# Patient Record
Sex: Female | Born: 1990 | Race: Black or African American | Hispanic: No | Smoking: Never smoker
Health system: Southern US, Community
[De-identification: ages and names within clinical notes are randomized; demographics above are authoritative.]

## PROBLEM LIST (undated history)

## (undated) ENCOUNTER — Inpatient Hospital Stay (HOSPITAL_COMMUNITY): Payer: Self-pay

## (undated) DIAGNOSIS — B999 Unspecified infectious disease: Secondary | ICD-10-CM

## (undated) DIAGNOSIS — A749 Chlamydial infection, unspecified: Secondary | ICD-10-CM

## (undated) DIAGNOSIS — A599 Trichomoniasis, unspecified: Secondary | ICD-10-CM

## (undated) DIAGNOSIS — F419 Anxiety disorder, unspecified: Secondary | ICD-10-CM

## (undated) DIAGNOSIS — F329 Major depressive disorder, single episode, unspecified: Secondary | ICD-10-CM

## (undated) DIAGNOSIS — G932 Benign intracranial hypertension: Secondary | ICD-10-CM

## (undated) DIAGNOSIS — R51 Headache: Secondary | ICD-10-CM

## (undated) DIAGNOSIS — F32A Depression, unspecified: Secondary | ICD-10-CM

## (undated) HISTORY — PX: WISDOM TOOTH EXTRACTION: SHX21

## (undated) HISTORY — PX: LUMBAR PUNCTURE: SHX1985

## (undated) HISTORY — PX: NO PAST SURGERIES: SHX2092

---
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
## 2011-04-09 ENCOUNTER — Inpatient Hospital Stay (INDEPENDENT_AMBULATORY_CARE_PROVIDER_SITE_OTHER)
Admission: RE | Admit: 2011-04-09 | Discharge: 2011-04-09 | Disposition: A | Discharge status: Home / Self Care | Payer: Medicaid Other | Source: Ambulatory Visit | Attending: Family Medicine | Admitting: Family Medicine

## 2011-04-09 DIAGNOSIS — J069 Acute upper respiratory infection, unspecified: Secondary | ICD-10-CM

## 2011-04-09 DIAGNOSIS — K602 Anal fissure, unspecified: Secondary | ICD-10-CM

## 2011-04-27 ENCOUNTER — Inpatient Hospital Stay (INDEPENDENT_AMBULATORY_CARE_PROVIDER_SITE_OTHER)
Admission: RE | Admit: 2011-04-27 | Discharge: 2011-04-27 | Disposition: A | Discharge status: Home / Self Care | Payer: Medicaid Other | Source: Ambulatory Visit | Attending: Emergency Medicine | Admitting: Emergency Medicine

## 2011-04-27 DIAGNOSIS — Z3201 Encounter for pregnancy test, result positive: Secondary | ICD-10-CM

## 2011-04-27 LAB — POCT URINALYSIS DIP (DEVICE)
Ketones, ur: NEGATIVE mg/dL
Protein, ur: NEGATIVE mg/dL
Urobilinogen, UA: 0.2 mg/dL (ref 0.0–1.0)
pH: 7.5 (ref 5.0–8.0)

## 2011-04-27 LAB — POCT PREGNANCY, URINE: Preg Test, Ur: POSITIVE

## 2011-04-28 LAB — URINE CULTURE: Culture  Setup Time: 201209302132

## 2011-04-30 ENCOUNTER — Inpatient Hospital Stay (HOSPITAL_COMMUNITY): Payer: Medicaid Other

## 2011-04-30 ENCOUNTER — Encounter (HOSPITAL_COMMUNITY): Payer: Self-pay | Admitting: *Deleted

## 2011-04-30 ENCOUNTER — Inpatient Hospital Stay (HOSPITAL_COMMUNITY)
Admission: AD | Admit: 2011-04-30 | Discharge: 2011-04-30 | Disposition: A | Discharge status: Home / Self Care | Payer: Medicaid Other | Source: Ambulatory Visit | Attending: Obstetrics & Gynecology | Admitting: Obstetrics & Gynecology

## 2011-04-30 DIAGNOSIS — O209 Hemorrhage in early pregnancy, unspecified: Secondary | ICD-10-CM | POA: Insufficient documentation

## 2011-04-30 DIAGNOSIS — B379 Candidiasis, unspecified: Secondary | ICD-10-CM

## 2011-04-30 DIAGNOSIS — A499 Bacterial infection, unspecified: Secondary | ICD-10-CM

## 2011-04-30 DIAGNOSIS — B373 Candidiasis of vulva and vagina: Secondary | ICD-10-CM | POA: Insufficient documentation

## 2011-04-30 DIAGNOSIS — B9689 Other specified bacterial agents as the cause of diseases classified elsewhere: Secondary | ICD-10-CM | POA: Insufficient documentation

## 2011-04-30 DIAGNOSIS — O4690 Antepartum hemorrhage, unspecified, unspecified trimester: Secondary | ICD-10-CM

## 2011-04-30 DIAGNOSIS — O2390 Unspecified genitourinary tract infection in pregnancy, unspecified trimester: Secondary | ICD-10-CM | POA: Insufficient documentation

## 2011-04-30 DIAGNOSIS — N760 Acute vaginitis: Secondary | ICD-10-CM | POA: Insufficient documentation

## 2011-04-30 DIAGNOSIS — B3731 Acute candidiasis of vulva and vagina: Secondary | ICD-10-CM | POA: Insufficient documentation

## 2011-04-30 LAB — CBC
MCV: 83 fL (ref 78.0–100.0)
Platelets: 187 10*3/uL (ref 150–400)
RDW: 13 % (ref 11.5–15.5)
WBC: 5.3 10*3/uL (ref 4.0–10.5)

## 2011-04-30 LAB — WET PREP, GENITAL: Trich, Wet Prep: NONE SEEN

## 2011-04-30 LAB — DIFFERENTIAL
Basophils Absolute: 0 10*3/uL (ref 0.0–0.1)
Eosinophils Relative: 1 % (ref 0–5)
Lymphocytes Relative: 47 % — ABNORMAL HIGH (ref 12–46)
Neutro Abs: 2.4 10*3/uL (ref 1.7–7.7)

## 2011-04-30 LAB — URINALYSIS, ROUTINE W REFLEX MICROSCOPIC
Ketones, ur: NEGATIVE mg/dL
Nitrite: NEGATIVE
Protein, ur: NEGATIVE mg/dL
pH: 7 (ref 5.0–8.0)

## 2011-04-30 LAB — POCT PREGNANCY, URINE: Preg Test, Ur: POSITIVE

## 2011-04-30 LAB — URINE MICROSCOPIC-ADD ON

## 2011-04-30 LAB — HCG, QUANTITATIVE, PREGNANCY: hCG, Beta Chain, Quant, S: 306 m[IU]/mL — ABNORMAL HIGH (ref <5)

## 2011-04-30 MED ORDER — METRONIDAZOLE 500 MG PO TABS: 500.0000 mg | ORAL_TABLET | Freq: Two times a day (BID) | ORAL | Status: AC

## 2011-04-30 MED ORDER — FLUCONAZOLE 150 MG PO TABS
150.0000 mg | ORAL_TABLET | Freq: Once | ORAL | Status: AC
  Administered 2011-04-30: 150 mg via ORAL
  Filled 2011-04-30: qty 1

## 2011-04-30 MED ORDER — PROMETHAZINE HCL 25 MG PO TABS: 25.0000 mg | ORAL_TABLET | Freq: Four times a day (QID) | ORAL | Status: AC | PRN

## 2011-04-30 NOTE — ED Provider Notes (Signed)
History     CSN: [id number] Arrival date & time: 04/30/2011 12:32 PM  Chief Complaint  Patient presents with  . Vaginal Bleeding    HPI Ariana Bentley is a.age female @ 4.[redacted] weeks gestation who presents to [facility]U for vaginal bleeding in early pregnancy with lower abdominal cramping. LMP 03/30/11 and lasted 3 days. Hx of chlamydia this year and treated along with partner. Last pap smear at [facility] this year and was normal.   Past Medical History  Diagnosis Date  . Asthma   . Pseudo-Turner syndrome     Past Surgical History  Procedure Date  . Lumbar puncture     No family history on file.  History  Substance Use Topics  . Smoking status: Current Some Day Smoker  . Smokeless tobacco: Not on file  . Alcohol Use: No    OB History    Grav Para Term Preterm Abortions TAB SAB Ect Mult Living   1               Review of Systems  Constitutional: Negative for fever, chills, diaphoresis and fatigue.  HENT: Negative for ear pain, congestion, sore throat, facial swelling, neck pain, neck stiffness, dental problem and sinus pressure.   Eyes: Negative for photophobia, pain and discharge.  Respiratory: Negative for cough, chest tightness and wheezing.   Cardiovascular: Negative.   Gastrointestinal: Positive for abdominal pain and constipation. Negative for nausea, vomiting, diarrhea and abdominal distention.  Genitourinary: Negative for dysuria, frequency, flank pain and difficulty urinating.  Musculoskeletal: Positive for back pain. Negative for myalgias and gait problem.  Skin: Negative for color change.       eczema  Neurological: Positive for headaches. Negative for dizziness, speech difficulty, weakness, light-headedness and numbness.  Psychiatric/Behavioral: Negative for confusion and agitation. The patient is not nervous/anxious.     Allergies  Doxycycline  Home Medications  No current outpatient prescriptions on file.  BP 114/65  Pulse 68  Temp(Src) 99.3 F (37.4 C)  (Oral)  Resp 18  Ht 5\' 6"  (1.676 m)  Wt 221 lb 2 oz (100.302 kg)  BMI 35.69 kg/m2  LMP 03/30/2011  Physical Exam  Nursing note and vitals reviewed. Constitutional: She is oriented to person, place, and time. She appears well-developed and well-nourished.  HENT:  Head: Normocephalic.  Eyes: EOM are normal.  Neck: Neck supple.  Pulmonary/Chest: Effort normal.  Abdominal: Soft. There is no tenderness.       obese  Genitourinary:       External genitalia pink, moist, without lesions. Frothy discharge, malodorous. Cervix closed, long. Minimal CMT, no adnexal tenderness. No palpable enlargement of the uterus; however, exam limited due to patient habitus.  Musculoskeletal: Normal range of motion.  Neurological: She is alert and oriented to person, place, and time. No cranial nerve deficit.  Skin: Skin is warm and dry.    ED Course  Procedures   Results for orders placed during the hospital encounter of 04/30/11 (from the past 24 hour(s))  URINALYSIS, ROUTINE W REFLEX MICROSCOPIC     Status: Abnormal   Collection Time   04/30/11  1:05 PM      Component Value Range   Color, Urine YELLOW  YELLOW    Appearance CLEAR  CLEAR    Specific Gravity, Urine 1.015  1.005 - 1.030    pH 7.0  5.0 - 8.0    Glucose, UA NEGATIVE  NEGATIVE (mg/dL)   Hgb urine dipstick NEGATIVE  NEGATIVE    Bilirubin Urine NEGATIVE  NEGATIVE    Ketones, ur NEGATIVE  NEGATIVE (mg/dL)   Protein, ur NEGATIVE  NEGATIVE (mg/dL)   Urobilinogen, UA 0.2  0.0 - 1.0 (mg/dL)   Nitrite NEGATIVE  NEGATIVE    Leukocytes, UA SMALL (*) NEGATIVE   URINE MICROSCOPIC-ADD ON     Status: Abnormal   Collection Time   04/30/11  1:05 PM      Component Value Range   Squamous Epithelial / LPF MANY (*) RARE    WBC, UA 0-2  <3 (WBC/hpf)   Bacteria, UA FEW (*) RARE   POCT PREGNANCY, URINE     Status: Normal   Collection Time   04/30/11  1:28 PM      Component Value Range   Preg Test, Ur POSITIVE    CBC     Status: Normal   Collection  Time   04/30/11  1:49 PM      Component Value Range   WBC 5.3  4.0 - 10.5 (K/uL)   RBC 4.99  3.87 - 5.11 (MIL/uL)   Hemoglobin 14.6  12.0 - 15.0 (g/dL)   HCT 16.1  09.6 - 04.5 (%)   MCV 83.0  78.0 - 100.0 (fL)   MCH 29.3  26.0 - 34.0 (pg)   MCHC 35.3  30.0 - 36.0 (g/dL)   RDW 40.9  81.1 - 91.4 (%)   Platelets 187  150 - 400 (K/uL)  DIFFERENTIAL     Status: Abnormal   Collection Time   04/30/11  1:49 PM      Component Value Range   Neutrophils Relative 45  43 - 77 (%)   Neutro Abs 2.4  1.7 - 7.7 (K/uL)   Lymphocytes Relative 47 (*) 12 - 46 (%)   Lymphs Abs 2.5  0.7 - 4.0 (K/uL)   Monocytes Relative 6  3 - 12 (%)   Monocytes Absolute 0.3  0.1 - 1.0 (K/uL)   Eosinophils Relative 1  0 - 5 (%)   Eosinophils Absolute 0.1  0.0 - 0.7 (K/uL)   Basophils Relative 0  0 - 1 (%)   Basophils Absolute 0.0  0.0 - 0.1 (K/uL)  HCG, QUANTITATIVE, PREGNANCY     Status: Abnormal   Collection Time   04/30/11  1:49 PM      Component Value Range   hCG, Beta Chain, Quant, S 306 (*) <5 (mIU/mL)  ABO/RH     Status: Normal   Collection Time   04/30/11  1:49 PM      Component Value Range   ABO/RH(D) O POS    WET PREP, GENITAL     Status: Abnormal   Collection Time   04/30/11  3:05 PM      Component Value Range   Yeast, Wet Prep FEW (*) NONE SEEN    Trich, Wet Prep NONE SEEN  NONE SEEN    Clue Cells, Wet Prep FEW (*) NONE SEEN    WBC, Wet Prep HPF POC TOO NUMEROUS TO COUNT (*) NONE SEEN    MDM  Ultrasound today shows no IUP or adnexal mass.   Assessment: Bleeding in early pregnancy   Monilia vaginosis   Bacterial vaginosis  Plan:  Diflucan 150 mg. Po now   Flagyl 500 mg. Po bid x 7 days   Phenergan 25 mg. Po q 6 hours prn nausea   Return in 2 days to repeat the Bhcg   Ectopic precautions.          [place], NP 04/30/11 1733  Tanner Vigna  Damian Leavell, NP 04/30/11 1737

## 2011-04-30 NOTE — Progress Notes (Signed)
Pt states, " [facility] and had orange bleeding when I wiped, but I'm not hurting."

## 2011-05-14 LAB — OB RESULTS CONSOLE ANTIBODY SCREEN: Antibody Screen: NEGATIVE

## 2011-05-14 LAB — OB RESULTS CONSOLE HEPATITIS B SURFACE ANTIGEN: Hepatitis B Surface Ag: NEGATIVE

## 2011-05-14 LAB — OB RESULTS CONSOLE RPR: RPR: NONREACTIVE

## 2011-05-16 ENCOUNTER — Inpatient Hospital Stay (HOSPITAL_COMMUNITY)
Admission: AD | Admit: 2011-05-16 | Discharge: 2011-05-16 | Disposition: A | Discharge status: Home / Self Care | Payer: Medicaid Other | Source: Ambulatory Visit | Attending: Obstetrics & Gynecology | Admitting: Obstetrics & Gynecology

## 2011-05-16 ENCOUNTER — Inpatient Hospital Stay (HOSPITAL_COMMUNITY): Payer: Medicaid Other

## 2011-05-16 ENCOUNTER — Encounter (HOSPITAL_COMMUNITY): Payer: Self-pay | Admitting: *Deleted

## 2011-05-16 DIAGNOSIS — Z3490 Encounter for supervision of normal pregnancy, unspecified, unspecified trimester: Secondary | ICD-10-CM

## 2011-05-16 DIAGNOSIS — Z1389 Encounter for screening for other disorder: Secondary | ICD-10-CM

## 2011-05-16 DIAGNOSIS — O99891 Other specified diseases and conditions complicating pregnancy: Secondary | ICD-10-CM | POA: Insufficient documentation

## 2011-05-16 HISTORY — DX: Chlamydial infection, unspecified: A74.9

## 2011-05-16 HISTORY — DX: Trichomoniasis, unspecified: A59.9

## 2011-05-16 LAB — CBC
Hemoglobin: 14.7 g/dL (ref 12.0–15.0)
MCH: 29.6 pg (ref 26.0–34.0)
MCHC: 35.9 g/dL (ref 30.0–36.0)
MCV: 82.3 fL (ref 78.0–100.0)

## 2011-05-16 LAB — HCG, QUANTITATIVE, PREGNANCY: hCG, Beta Chain, Quant, S: 47502 m[IU]/mL — ABNORMAL HIGH (ref <5)

## 2011-05-16 NOTE — ED Provider Notes (Signed)
History     Chief Complaint  Patient presents with  . Possible Pregnancy   HPI Care assumed from [facility] NP.   Awaited [place] results. Quant HCG pending   Past Medical History  Diagnosis Date  . Asthma   . Pseudo-Turner syndrome   . Trichomonas   . Chlamydia     Past Surgical History  Procedure Date  . Lumbar puncture     History reviewed. No pertinent family history.  History  Substance Use Topics  . Smoking status: Current Some Day Smoker  . Smokeless tobacco: Not on file  . Alcohol Use: No    Allergies:  Allergies  Allergen Reactions  . Shellfish Allergy Anaphylaxis  . Doxycycline Other (See Comments)    Causes swelling behind the eyes.    Prescriptions prior to admission  Medication Sig Dispense Refill  . polyethylene glycol (MIRALAX / GLYCOLAX) packet Take 17 g by mouth daily as needed. bm       . prenatal vitamin w/FE, FA (PRENATAL 1 + 1) 27-1 MG TABS Take 1 tablet by mouth daily.        Tery Sanfilippo Sodium (EASY-LAX PO) Take 1 tablet by mouth daily as needed. For constipation.       . lidocaine (XYLOCAINE) 5 % ointment Apply 1 application topically as needed. For pain and itching.        ROS See previous provider notes.  Physical Exam   Blood pressure 135/63, pulse 94, temperature 99.2 F (37.3 C), temperature source Oral, resp. rate 20, height 5\' 7"  (1.702 m), weight 224 lb (101.606 kg), last menstrual period 03/30/2011, SpO2 99.00%.  Physical Exam See previous provider notes. [place]:  SIUP at 6.5 weeks FHR 128 + yolk sac Left CLC Otherwise normal.  MAU Course  Procedures  Assessment and Plan  A:  IUP at 6.5 weeks FHR 128  P:  Has appt with Dr Gaynell Face tomorrow.  Will followup with him.   [facility] 05/16/2011, 3:39 PM

## 2011-05-16 NOTE — Progress Notes (Signed)
Pt states she was straining to have BM this am and noticed some blood when she wiped. Pt does not know if it is coming from her vagina or rectum.

## 2011-06-29 ENCOUNTER — Emergency Department (HOSPITAL_COMMUNITY)
Admission: EM | Admit: 2011-06-29 | Discharge: 2011-06-30 | Disposition: A | Discharge status: Home / Self Care | Payer: Medicaid Other | Attending: Emergency Medicine | Admitting: Emergency Medicine

## 2011-06-29 ENCOUNTER — Encounter (HOSPITAL_COMMUNITY): Payer: Self-pay | Admitting: Emergency Medicine

## 2011-06-29 DIAGNOSIS — O2390 Unspecified genitourinary tract infection in pregnancy, unspecified trimester: Secondary | ICD-10-CM | POA: Insufficient documentation

## 2011-06-29 DIAGNOSIS — N390 Urinary tract infection, site not specified: Secondary | ICD-10-CM | POA: Insufficient documentation

## 2011-06-29 DIAGNOSIS — R51 Headache: Secondary | ICD-10-CM | POA: Insufficient documentation

## 2011-06-29 DIAGNOSIS — J45909 Unspecified asthma, uncomplicated: Secondary | ICD-10-CM | POA: Insufficient documentation

## 2011-06-29 DIAGNOSIS — E860 Dehydration: Secondary | ICD-10-CM | POA: Insufficient documentation

## 2011-06-29 DIAGNOSIS — O26899 Other specified pregnancy related conditions, unspecified trimester: Secondary | ICD-10-CM

## 2011-06-29 DIAGNOSIS — O99891 Other specified diseases and conditions complicating pregnancy: Secondary | ICD-10-CM | POA: Insufficient documentation

## 2011-06-29 DIAGNOSIS — O2340 Unspecified infection of urinary tract in pregnancy, unspecified trimester: Secondary | ICD-10-CM

## 2011-06-29 LAB — URINALYSIS, ROUTINE W REFLEX MICROSCOPIC
Bilirubin Urine: NEGATIVE
Glucose, UA: NEGATIVE mg/dL
Hgb urine dipstick: NEGATIVE
Ketones, ur: >80 mg/dL — AB
pH: 6 (ref 5.0–8.0)

## 2011-06-29 LAB — URINE MICROSCOPIC-ADD ON

## 2011-06-29 LAB — POCT I-STAT, CHEM 8
HCT: 42 % (ref 36.0–46.0)
Hemoglobin: 14.3 g/dL (ref 12.0–15.0)
Potassium: 3.7 mEq/L (ref 3.5–5.1)
Sodium: 136 mEq/L (ref 135–145)
TCO2: 19 mmol/L (ref 0–100)

## 2011-06-29 MED ORDER — BUTALBITAL-APAP-CAFFEINE 50-325-40 MG PO TABS: 1.0000 | ORAL_TABLET | Freq: Four times a day (QID) | ORAL | Status: DC | PRN

## 2011-06-29 MED ORDER — NITROFURANTOIN MONOHYD MACRO 100 MG PO CAPS
100.0000 mg | ORAL_CAPSULE | ORAL | Status: AC
  Administered 2011-06-30: 100 mg via ORAL
  Filled 2011-06-29: qty 1

## 2011-06-29 MED ORDER — ONDANSETRON HCL 4 MG PO TABS: 4.0000 mg | ORAL_TABLET | Freq: Four times a day (QID) | ORAL | Status: AC

## 2011-06-29 MED ORDER — NITROFURANTOIN MONOHYD MACRO 100 MG PO CAPS: 100.0000 mg | ORAL_CAPSULE | Freq: Two times a day (BID) | ORAL | Status: AC

## 2011-06-29 MED ORDER — ONDANSETRON 4 MG PO TBDP
4.0000 mg | ORAL_TABLET | Freq: Once | ORAL | Status: AC
  Administered 2011-06-29: 4 mg via ORAL
  Filled 2011-06-29: qty 1

## 2011-06-29 MED ORDER — ACETAMINOPHEN 325 MG PO TABS
650.0000 mg | ORAL_TABLET | Freq: Once | ORAL | Status: AC
  Administered 2011-06-29: 650 mg via ORAL
  Filled 2011-06-29: qty 1

## 2011-06-29 NOTE — ED Notes (Signed)
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

## 2011-06-29 NOTE — ED Notes (Signed)
C/o pain to R side of head since 9am.

## 2011-06-29 NOTE — ED Notes (Signed)
Barcode for meds would not allow to scan down hallway away from computer, had to override in computer,

## 2011-06-29 NOTE — ED Notes (Signed)
Pt is [redacted] weeks pregnant.  Reports prenatal care.

## 2011-06-29 NOTE — ED Provider Notes (Cosign Needed)
History     CSN: [id number] Arrival date & time: 06/29/2011  8:10 PM   First MD Initiated Contact with Patient 06/29/11 2126      Chief Complaint  Patient presents with  . Headache   patient is a 20 year old female with a history of asthma,  Pseudotumor,  trichomonas, chlamydia, and states she is currently 13 weeks, pregnant. She's been having a right-sided headache since this morning, which began with a gradual onset. She has minimal nausea. The pain is described as sharp and throbbing. She did take Tylenol with some relief. She's had no vomiting, no visual or speech changes, no neck pain, no numbness, weakness or tingling. Denies any fever or any recent injuries.  (Consider location/radiation/quality/duration/timing/severity/associated sxs/prior treatment) HPI  Past Medical History  Diagnosis Date  . Asthma   . Pseudo-Turner syndrome   . Trichomonas   . Chlamydia     Past Surgical History  Procedure Date  . Lumbar puncture     No family history on file.  History  Substance Use Topics  . Smoking status: Current Some Day Smoker  . Smokeless tobacco: Not on file  . Alcohol Use: No    OB History    Grav Para Term Preterm Abortions TAB SAB Ect Mult Living   1               Review of Systems  All other systems reviewed and are negative.    Allergies  Shellfish allergy and Doxycycline  Home Medications   Current Outpatient Rx  Name Route Sig Dispense Refill  . POLYETHYLENE GLYCOL 3350 PO PACK Oral Take 17 g by mouth daily as needed. bm     . PRENATAL PLUS 27-1 MG PO TABS Oral Take 1 tablet by mouth daily.        BP 117/72  Pulse 94  Temp(Src) 98.3 F (36.8 C) (Oral)  Resp 18  SpO2 98%  LMP 03/30/2011  Physical Exam  Constitutional: She is oriented to person, place, and time. She appears well-developed and well-nourished.       Well-nourished well-developed, calm, comfortable, cooperative, no acute distress  HENT:  Head: Normocephalic and atraumatic.    Eyes: Conjunctivae and EOM are normal. Pupils are equal, round, and reactive to light.  Neck: Neck supple. No JVD present. No thyromegaly present.  Cardiovascular: Normal rate and regular rhythm.  Exam reveals no gallop and no friction rub.   No murmur heard. Pulmonary/Chest: Breath sounds normal. She has no wheezes. She has no rales. She exhibits no tenderness.  Abdominal: Soft. Bowel sounds are normal. She exhibits no distension. There is no tenderness. There is no rebound and no guarding.  Musculoskeletal: Normal range of motion.  Lymphadenopathy:    She has no cervical adenopathy.  Neurological: She is alert and oriented to person, place, and time. She has normal reflexes. No cranial nerve deficit. Coordination normal.       Neurologic exam shows no focal motor deficits. Patient is awake, alert, oriented. Reflexes are equal and symmetric.  Skin: Skin is warm and dry. No rash noted.  Psychiatric: She has a normal mood and affect.    ED Course  Procedures (including critical care time)  Labs Reviewed - No data to display No results found.   No diagnosis found.    MDM  Pt is seen and examined;  Initial history and physical completed.  Will follow.     11:07 PM Results for orders placed during the hospital encounter of 06/29/11  URINALYSIS, ROUTINE W REFLEX MICROSCOPIC      Component Value Range   Color, Urine YELLOW  YELLOW    APPearance CLOUDY (*) CLEAR    Specific Gravity, Urine 1.018  1.005 - 1.030    pH 6.0  5.0 - 8.0    Glucose, UA NEGATIVE  NEGATIVE (mg/dL)   Hgb urine dipstick NEGATIVE  NEGATIVE    Bilirubin Urine NEGATIVE  NEGATIVE    Ketones, ur >80 (*) NEGATIVE (mg/dL)   Protein, ur NEGATIVE  NEGATIVE (mg/dL)   Urobilinogen, UA 1.0  0.0 - 1.0 (mg/dL)   Nitrite NEGATIVE  NEGATIVE    Leukocytes, UA LARGE (*) NEGATIVE   POCT PREGNANCY, URINE      Component Value Range   Preg Test, Ur POSITIVE    URINE MICROSCOPIC-ADD ON      Component Value Range    Squamous Epithelial / LPF RARE  RARE    WBC, UA 7-10  <3 (WBC/hpf)   RBC / HPF 0-2  <3 (RBC/hpf)   Bacteria, UA FEW (*) RARE   POCT I-STAT, CHEM 8      Component Value Range   Sodium 136  135 - 145 (mEq/L)   Potassium 3.7  3.5 - 5.1 (mEq/L)   Chloride 105  96 - 112 (mEq/L)   BUN 7  6 - 23 (mg/dL)   Creatinine, Ser 4.09  0.50 - 1.10 (mg/dL)   Glucose, Bld 82  70 - 99 (mg/dL)   Calcium, Ion 8.11  9.14 - 1.32 (mmol/L)   TCO2 19  0 - 100 (mmol/L)   Hemoglobin 14.3  12.0 - 15.0 (g/dL)   HCT 78.2  95.6 - 21.3 (%)   No results found.  Urine analysis has been reviewed, showing positive elements for UTI. Electrolyte panel is normal, negative for protein in the urine. Will discuss this with her primary OB/GYN doctor for further recommendations.    11:24 PM  CT scan reviewed, will discuss with patient's primary obstetrician. Otherwise, remains very stable.        Johnsie Moscoso A. Patrica Duel, MD 06/29/11 2324  11:31 PM Findings discussed with OB/GYN. They are recommending conservative outpatient therapy, including Zofran, Fioricet, and IV fluid hydration. Also, agreeing with antibiotics for UTI. Patient is consuming Sprite without any difficulty and requesting food, which she is given. She is told to followup with her primary OB doctor, Dr. Gaynell Face, within 1-2 days. Return to the ER for any concerns or changing symptoms     Shrihaan Porzio A. Patrica Duel, MD 06/29/11 [phone]

## 2011-07-29 NOTE — L&D Delivery Note (Signed)
Delivery Note At 10:04 AM a viable female was delivered via  (Presentation: direct OA).     Placenta status: delivered intact.  Cord:  Nuchal cord x 1.  Clamped/cut prior to delivery of the anterior shoulder.  3 vessels.  Anesthesia: Epidural  Episiotomy: none Lacerations: sulcal Suture Repair: 3.0 vicryl rapide Est. Blood Loss (mL): 200 ml  Mom to postpartum.  Baby to nursery-stable.  JACKSON[place]MOORE,Chief Walkup A 12/20/2011, 10:23 AM

## 2011-09-07 ENCOUNTER — Encounter (HOSPITAL_COMMUNITY): Payer: Self-pay | Admitting: *Deleted

## 2011-09-07 ENCOUNTER — Inpatient Hospital Stay (HOSPITAL_COMMUNITY)
Admission: AD | Admit: 2011-09-07 | Discharge: 2011-09-07 | Disposition: A | Discharge status: Home / Self Care | Payer: Medicaid Other | Source: Ambulatory Visit | Attending: Obstetrics | Admitting: Obstetrics

## 2011-09-07 DIAGNOSIS — J40 Bronchitis, not specified as acute or chronic: Secondary | ICD-10-CM

## 2011-09-07 DIAGNOSIS — G932 Benign intracranial hypertension: Secondary | ICD-10-CM | POA: Insufficient documentation

## 2011-09-07 DIAGNOSIS — M545 Low back pain, unspecified: Secondary | ICD-10-CM | POA: Insufficient documentation

## 2011-09-07 DIAGNOSIS — B3731 Acute candidiasis of vulva and vagina: Secondary | ICD-10-CM | POA: Insufficient documentation

## 2011-09-07 DIAGNOSIS — O99891 Other specified diseases and conditions complicating pregnancy: Secondary | ICD-10-CM | POA: Insufficient documentation

## 2011-09-07 DIAGNOSIS — J069 Acute upper respiratory infection, unspecified: Secondary | ICD-10-CM | POA: Insufficient documentation

## 2011-09-07 DIAGNOSIS — M5430 Sciatica, unspecified side: Secondary | ICD-10-CM

## 2011-09-07 DIAGNOSIS — B373 Candidiasis of vulva and vagina: Secondary | ICD-10-CM

## 2011-09-07 DIAGNOSIS — O98819 Other maternal infectious and parasitic diseases complicating pregnancy, unspecified trimester: Secondary | ICD-10-CM | POA: Insufficient documentation

## 2011-09-07 HISTORY — DX: Benign intracranial hypertension: G93.2

## 2011-09-07 LAB — DIFFERENTIAL
Eosinophils Absolute: 0.1 10*3/uL (ref 0.0–0.7)
Eosinophils Relative: 1 % (ref 0–5)
Lymphs Abs: 2.3 10*3/uL (ref 0.7–4.0)
Monocytes Absolute: 0.5 10*3/uL (ref 0.1–1.0)

## 2011-09-07 LAB — COMPREHENSIVE METABOLIC PANEL
ALT: 14 U/L (ref 0–35)
BUN: 6 mg/dL (ref 6–23)
Calcium: 8.6 mg/dL (ref 8.4–10.5)
Creatinine, Ser: 0.55 mg/dL (ref 0.50–1.10)
GFR calc Af Amer: >90 mL/min (ref >90)
Glucose, Bld: 94 mg/dL (ref 70–99)
Sodium: 135 mEq/L (ref 135–145)
Total Protein: 6.2 g/dL (ref 6.0–8.3)

## 2011-09-07 LAB — URINALYSIS, ROUTINE W REFLEX MICROSCOPIC
Glucose, UA: NEGATIVE mg/dL
Leukocytes, UA: NEGATIVE
Protein, ur: NEGATIVE mg/dL
pH: 6 (ref 5.0–8.0)

## 2011-09-07 LAB — CBC
MCH: 29.1 pg (ref 26.0–34.0)
MCV: 85.2 fL (ref 78.0–100.0)
Platelets: 167 10*3/uL (ref 150–400)
RBC: 4.12 MIL/uL (ref 3.87–5.11)

## 2011-09-07 LAB — WET PREP, GENITAL: Trich, Wet Prep: NONE SEEN

## 2011-09-07 MED ORDER — AZITHROMYCIN 250 MG PO TABS: ORAL_TABLET | ORAL | Status: AC

## 2011-09-07 MED ORDER — FLUCONAZOLE 150 MG PO TABS
150.0000 mg | ORAL_TABLET | Freq: Once | ORAL | Status: AC
  Administered 2011-09-07: 150 mg via ORAL
  Filled 2011-09-07: qty 1

## 2011-09-07 NOTE — Progress Notes (Signed)
Pt presents with R sided lower back pain which is worse when bending over. Pt also has has a cough for 3 days and vomiting 2 times today. Pt states that she has some vaginal itching and white discharge. Pt states that she has not "eaten all day". " I tried to have cereal and thew it up".

## 2011-09-07 NOTE — ED Provider Notes (Signed)
History     CSN: [id number]  Arrival date & time 09/07/11  1437   None     Chief Complaint  Patient presents with  . Nasal Congestion  . Back Pain  . Vaginal Itching   HPI Ariana Bentley is a 21 y.o. female @ [redacted]w[redacted]d gestation who presents to [facility] for back pain, vaginal itching and discharge and URI symptoms. Symptoms started 3 days ago. Coughing up yellow mucous. Coughed till vomited one time today. Vaginal discharge that is white. 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 Current sex partner x 5 months. Hx of Chlamydia.   Past Medical History  Diagnosis Date  . Asthma   . Trichomonas   . Chlamydia   . Pseudotumor cerebri     Past Surgical History  Procedure Date  . Lumbar puncture     Family History  Problem Relation Age of Onset  . Anesthesia problems Neg Hx     History  Substance Use Topics  . Smoking status: Current Some Day Smoker  . Smokeless tobacco: Not on file  . Alcohol Use: No    OB History    Grav Para Term Preterm Abortions TAB SAB Ect Mult Living   1               Review of Systems  Constitutional: Negative for fever, chills, diaphoresis and fatigue.  HENT: Positive for congestion and sore throat. Negative for ear pain, facial swelling, neck pain, neck stiffness, dental problem and sinus pressure.   Eyes: Negative for photophobia, pain and discharge.  Respiratory: Negative for cough, chest tightness and wheezing.   Cardiovascular: Negative.   Gastrointestinal: Negative for nausea, vomiting, abdominal pain, diarrhea, constipation and abdominal distention.  Genitourinary: Positive for flank pain and vaginal discharge. Negative for dysuria, frequency, vaginal bleeding, difficulty urinating and pelvic pain. Vaginal pain: vaginal itching.  Musculoskeletal: Positive for back pain. Negative for myalgias and gait problem.  Skin: Positive for rash (eczema). Negative for color change.  Neurological: Positive for light-headedness and headaches. Negative for  dizziness, speech difficulty, weakness and numbness.  Psychiatric/Behavioral: Negative for confusion and agitation. The patient is not nervous/anxious.     Allergies  Shellfish allergy and Doxycycline  Home Medications  No current outpatient prescriptions on file.  Temp(Src) 97.7 F (36.5 C) (Oral)  Resp 18  Ht 5\' 7"  (1.702 m)  Wt 232 lb 9.6 oz (105.507 kg)  BMI 36.43 kg/m2  LMP 03/30/2011  Physical Exam  Nursing note and vitals reviewed. Constitutional: She is oriented to person, place, and time. She appears well-developed and well-nourished.  HENT:  Head: Normocephalic.  Eyes: EOM are normal.  Neck: Neck supple.  Cardiovascular: Normal rate and regular rhythm.   Pulmonary/Chest: Effort normal. No respiratory distress. She has no wheezes.       Occasional ronchi  Abdominal: Soft. There is no tenderness.  Genitourinary:       External genitalia without lesions. White discharge vaginal vault. Cervix closed, thick, high. Uterus consistent with dates.   Musculoskeletal: Normal range of motion.       Pain in buttocks with palpation in area of sciatic nerve.   Neurological: She is alert and oriented to person, place, and time. No cranial nerve deficit.  Skin: Skin is warm and dry.  Psychiatric: She has a normal mood and affect. Her behavior is normal. Judgment and thought content normal.   Results for orders placed during the hospital encounter of 09/07/11 (from the past 24 hour(s))  URINALYSIS, ROUTINE  W REFLEX MICROSCOPIC     Status: Abnormal   Collection Time   09/07/11  2:51 PM      Component Value Range   Color, Urine YELLOW  YELLOW    APPearance HAZY (*) CLEAR    Specific Gravity, Urine 1.025  1.005 - 1.030    pH 6.0  5.0 - 8.0    Glucose, UA NEGATIVE  NEGATIVE (mg/dL)   Hgb urine dipstick NEGATIVE  NEGATIVE    Bilirubin Urine NEGATIVE  NEGATIVE    Ketones, ur NEGATIVE  NEGATIVE (mg/dL)   Protein, ur NEGATIVE  NEGATIVE (mg/dL)   Urobilinogen, UA 0.2  0.0 - 1.0  (mg/dL)   Nitrite NEGATIVE  NEGATIVE    Leukocytes, UA NEGATIVE  NEGATIVE   CBC     Status: Abnormal   Collection Time   09/07/11  4:14 PM      Component Value Range   WBC 8.2  4.0 - 10.5 (K/uL)   RBC 4.12  3.87 - 5.11 (MIL/uL)   Hemoglobin 12.0  12.0 - 15.0 (g/dL)   HCT 40.9 (*) 81.1 - 46.0 (%)   MCV 85.2  78.0 - 100.0 (fL)   MCH 29.1  26.0 - 34.0 (pg)   MCHC 34.2  30.0 - 36.0 (g/dL)   RDW 91.4  78.2 - 95.6 (%)   Platelets 167  150 - 400 (K/uL)  DIFFERENTIAL     Status: Normal   Collection Time   09/07/11  4:14 PM      Component Value Range   Neutrophils Relative 64  43 - 77 (%)   Neutro Abs 5.2  1.7 - 7.7 (K/uL)   Lymphocytes Relative 29  12 - 46 (%)   Lymphs Abs 2.3  0.7 - 4.0 (K/uL)   Monocytes Relative 6  3 - 12 (%)   Monocytes Absolute 0.5  0.1 - 1.0 (K/uL)   Eosinophils Relative 1  0 - 5 (%)   Eosinophils Absolute 0.1  0.0 - 0.7 (K/uL)   Basophils Relative 0  0 - 1 (%)   Basophils Absolute 0.0  0.0 - 0.1 (K/uL)  COMPREHENSIVE METABOLIC PANEL     Status: Abnormal   Collection Time   09/07/11  4:14 PM      Component Value Range   Sodium 135  135 - 145 (mEq/L)   Potassium 3.5  3.5 - 5.1 (mEq/L)   Chloride 102  96 - 112 (mEq/L)   CO2 23  19 - 32 (mEq/L)   Glucose, Bld 94  70 - 99 (mg/dL)   BUN 6  6 - 23 (mg/dL)   Creatinine, Ser 2.13  0.50 - 1.10 (mg/dL)   Calcium 8.6  8.4 - 08.6 (mg/dL)   Total Protein 6.2  6.0 - 8.3 (g/dL)   Albumin 3.0 (*) 3.5 - 5.2 (g/dL)   AST 14  0 - 37 (U/L)   ALT 14  0 - 35 (U/L)   Alkaline Phosphatase 42  39 - 117 (U/L)   Total Bilirubin 0.4  0.3 - 1.2 (mg/dL)   GFR calc non Af Amer >90  >90 (mL/min)   GFR calc Af Amer >90  >90 (mL/min)  WET PREP, GENITAL     Status: Abnormal   Collection Time   09/07/11  5:20 PM      Component Value Range   Yeast Wet Prep HPF POC MODERATE (*) NONE SEEN    Trich, Wet Prep NONE SEEN  NONE SEEN    Clue Cells Wet Prep HPF  POC NONE SEEN  NONE SEEN    WBC, Wet Prep HPF POC MODERATE (*) NONE SEEN    EFM:  Baseline FH 140, reassuring, no contractions  ED Course: Patient ate 2 hamburgers and drank 2 sprites while in MAU without nausea or cough.   Procedures  Assessment: URI   Monilia vaginitis   Sciatica  Plan:  Rx Z-Pak   Robitussin OTC   Diflucan 150 mg po now   Tylenol for discomfort   Follow up with Dr. Shana Chute MDM          Kerrie [place], NP 09/07/11 1754

## 2011-09-07 NOTE — Progress Notes (Signed)
Pt reports having Back pain. Having a cough and bad nasal congestion. When she coughs her back hurts more.

## 2011-09-08 LAB — GC/CHLAMYDIA PROBE AMP, GENITAL: GC Probe Amp, Genital: NEGATIVE

## 2011-12-19 ENCOUNTER — Encounter (HOSPITAL_COMMUNITY): Payer: Self-pay | Admitting: *Deleted

## 2011-12-19 ENCOUNTER — Inpatient Hospital Stay (HOSPITAL_COMMUNITY)
Admission: AD | Admit: 2011-12-19 | Discharge: 2011-12-19 | Disposition: A | Discharge status: Home / Self Care | Payer: Medicaid Other | Source: Ambulatory Visit | Attending: Obstetrics | Admitting: Obstetrics

## 2011-12-19 DIAGNOSIS — R109 Unspecified abdominal pain: Secondary | ICD-10-CM | POA: Insufficient documentation

## 2011-12-19 DIAGNOSIS — O99891 Other specified diseases and conditions complicating pregnancy: Secondary | ICD-10-CM | POA: Insufficient documentation

## 2011-12-19 HISTORY — DX: Headache: R51

## 2011-12-19 NOTE — Discharge Instructions (Signed)
Fetal Movement Counts Patient Name: __________________________________________________ Patient Due Date: ____________________ Kick counts is highly recommended in high risk pregnancies, but it is a good idea for every pregnant woman to do. Start counting fetal movements at 28 weeks of the pregnancy. Fetal movements increase after eating a full meal or eating or drinking something sweet (the blood sugar is higher). It is also important to drink plenty of fluids (well hydrated) before doing the count. Lie on your left side because it helps with the circulation or you can sit in a comfortable chair with your arms over your belly (abdomen) with no distractions around you. DOING THE COUNT  Try to do the count the same time of day each time you do it.   Mark the day and time, then see how long it takes for you to feel 10 movements (kicks, flutters, swishes, rolls). You should have at least 10 movements within 2 hours. You will most likely feel 10 movements in much less than 2 hours. If you do not, wait an hour and count again. After a couple of days you will see a pattern.   What you are looking for is a change in the pattern or not enough counts in 2 hours. Is it taking longer in time to reach 10 movements?  SEEK MEDICAL CARE IF:  You feel less than 10 counts in 2 hours. Tried twice.   No movement in one hour.   The pattern is changing or taking longer each day to reach 10 counts in 2 hours.   You feel the baby is not moving as it usually does.  Date: ____________ Movements: ____________ Start time: ____________ Finish time: ____________  Date: ____________ Movements: ____________ Start time: ____________ Finish time: ____________ Date: ____________ Movements: ____________ Start time: ____________ Finish time: ____________ Date: ____________ Movements: ____________ Start time: ____________ Finish time: ____________ Date: ____________ Movements: ____________ Start time: ____________ Finish time:  ____________ Date: ____________ Movements: ____________ Start time: ____________ Finish time: ____________ Date: ____________ Movements: ____________ Start time: ____________ Finish time: ____________ Date: ____________ Movements: ____________ Start time: ____________ Finish time: ____________  Date: ____________ Movements: ____________ Start time: ____________ Finish time: ____________ Date: ____________ Movements: ____________ Start time: ____________ Finish time: ____________ Date: ____________ Movements: ____________ Start time: ____________ Finish time: ____________ Date: ____________ Movements: ____________ Start time: ____________ Finish time: ____________ Date: ____________ Movements: ____________ Start time: ____________ Finish time: ____________ Date: ____________ Movements: ____________ Start time: ____________ Finish time: ____________ Date: ____________ Movements: ____________ Start time: ____________ Finish time: ____________  Date: ____________ Movements: ____________ Start time: ____________ Finish time: ____________ Date: ____________ Movements: ____________ Start time: ____________ Finish time: ____________ Date: ____________ Movements: ____________ Start time: ____________ Finish time: ____________ Date: ____________ Movements: ____________ Start time: ____________ Finish time: ____________ Date: ____________ Movements: ____________ Start time: ____________ Finish time: ____________ Date: ____________ Movements: ____________ Start time: ____________ Finish time: ____________ Date: ____________ Movements: ____________ Start time: ____________ Finish time: ____________  Date: ____________ Movements: ____________ Start time: ____________ Finish time: ____________ Date: ____________ Movements: ____________ Start time: ____________ Finish time: ____________ Date: ____________ Movements: ____________ Start time: ____________ Finish time: ____________ Date: ____________ Movements:  ____________ Start time: ____________ Finish time: ____________ Date: ____________ Movements: ____________ Start time: ____________ Finish time: ____________ Date: ____________ Movements: ____________ Start time: ____________ Finish time: ____________ Date: ____________ Movements: ____________ Start time: ____________ Finish time: ____________  Date: ____________ Movements: ____________ Start time: ____________ Finish time: ____________ Date: ____________ Movements: ____________ Start time: ____________ Finish time: ____________ Date: ____________ Movements: ____________ Start time:   ____________ Finish time: ____________ Date: ____________ Movements: ____________ Start time: ____________ Finish time: ____________ Date: ____________ Movements: ____________ Start time: ____________ Finish time: ____________ Date: ____________ Movements: ____________ Start time: ____________ Finish time: ____________ Date: ____________ Movements: ____________ Start time: ____________ Finish time: ____________  Date: ____________ Movements: ____________ Start time: ____________ Finish time: ____________ Date: ____________ Movements: ____________ Start time: ____________ Finish time: ____________ Date: ____________ Movements: ____________ Start time: ____________ Finish time: ____________ Date: ____________ Movements: ____________ Start time: ____________ Finish time: ____________ Date: ____________ Movements: ____________ Start time: ____________ Finish time: ____________ Date: ____________ Movements: ____________ Start time: ____________ Finish time: ____________ Date: ____________ Movements: ____________ Start time: ____________ Finish time: ____________  Date: ____________ Movements: ____________ Start time: ____________ Finish time: ____________ Date: ____________ Movements: ____________ Start time: ____________ Finish time: ____________ Date: ____________ Movements: ____________ Start time: ____________ Finish  time: ____________ Date: ____________ Movements: ____________ Start time: ____________ Finish time: ____________ Date: ____________ Movements: ____________ Start time: ____________ Finish time: ____________ Date: ____________ Movements: ____________ Start time: ____________ Finish time: ____________ Date: ____________ Movements: ____________ Start time: ____________ Finish time: ____________  Date: ____________ Movements: ____________ Start time: ____________ Finish time: ____________ Date: ____________ Movements: ____________ Start time: ____________ Finish time: ____________ Date: ____________ Movements: ____________ Start time: ____________ Finish time: ____________ Date: ____________ Movements: ____________ Start time: ____________ Finish time: ____________ Date: ____________ Movements: ____________ Start time: ____________ Finish time: ____________ Date: ____________ Movements: ____________ Start time: ____________ Finish time: ____________ Document Released: 08/13/2006 Document Revised: 07/03/2011 Document Reviewed: 02/13/2009 [employer] Patient Information 2012 [employer].Braxton Hicks Contractions Pregnancy is commonly associated with contractions of the uterus throughout the pregnancy. Towards the end of pregnancy (32 to 34 weeks), these contractions (Braxton Hicks) can develop more often and may become more forceful. This is not true labor because these contractions do not result in opening (dilatation) and thinning of the cervix. They are sometimes difficult to tell apart from true labor because these contractions can be forceful and people have different pain tolerances. You should not feel embarrassed if you go to the hospital with false labor. Sometimes, the only way to tell if you are in true labor is for your caregiver to follow the changes in the cervix. How to tell the difference between true and false labor:  False labor.   The contractions of false labor are usually shorter,  irregular and not as hard as those of true labor.   They are often felt in the front of the lower abdomen and in the groin.   They may leave with walking around or changing positions while lying down.   They get weaker and are shorter lasting as time goes on.   These contractions are usually irregular.   They do not usually become progressively stronger, regular and closer together as with true labor.   True labor.   Contractions in true labor last 30 to 70 seconds, become very regular, usually become more intense, and increase in frequency.   They do not go away with walking.   The discomfort is usually felt in the top of the uterus and spreads to the lower abdomen and low back.   True labor can be determined by your caregiver with an exam. This will show that the cervix is dilating and getting thinner.  If there are no prenatal problems or other health problems associated with the pregnancy, it is completely safe to be sent home with false labor and await the onset of true labor. HOME CARE INSTRUCTIONS   Keep up   with your usual exercises and instructions.   Take medications as directed.   Keep your regular prenatal appointment.   Eat and drink lightly if you think you are going into labor.   If BH contractions are making you uncomfortable:   Change your activity position from lying down or resting to walking/walking to resting.   Sit and rest in a tub of warm water.   Drink 2 to 3 glasses of water. Dehydration may cause B-H contractions.   Do slow and deep breathing several times an hour.  SEEK IMMEDIATE MEDICAL CARE IF:   Your contractions continue to become stronger, more regular, and closer together.   You have a gushing, burst or leaking of fluid from the vagina.   An oral temperature above 102 F (38.9 C) develops.   You have passage of blood-tinged mucus.   You develop vaginal bleeding.   You develop continuous belly (abdominal) pain.   You have low  back pain that you never had before.   You feel the baby's head pushing down causing pelvic pressure.   The baby is not moving as much as it used to.  Document Released: 07/14/2005 Document Revised: 07/03/2011 Document Reviewed: 01/05/2009 [employer] Patient Information 2012 [employer]. 

## 2011-12-19 NOTE — MAU Note (Signed)
Pains started this morning, "bouncing around", every 7,10,28minutes.  Was checked yesterday in office. No problems, first preg

## 2011-12-20 ENCOUNTER — Inpatient Hospital Stay (HOSPITAL_COMMUNITY)
Admission: AD | Admit: 2011-12-20 | Discharge: 2011-12-22 | DRG: 775 | Disposition: A | Discharge status: Home / Self Care | Payer: Medicaid Other | Source: Ambulatory Visit | Attending: Obstetrics | Admitting: Obstetrics

## 2011-12-20 ENCOUNTER — Encounter (HOSPITAL_COMMUNITY): Payer: Self-pay | Admitting: Anesthesiology

## 2011-12-20 ENCOUNTER — Encounter (HOSPITAL_COMMUNITY): Payer: Self-pay | Admitting: *Deleted

## 2011-12-20 ENCOUNTER — Inpatient Hospital Stay (HOSPITAL_COMMUNITY): Payer: Medicaid Other | Admitting: Anesthesiology

## 2011-12-20 DIAGNOSIS — IMO0001 Reserved for inherently not codable concepts without codable children: Secondary | ICD-10-CM

## 2011-12-20 LAB — CBC
HCT: 37.5 % (ref 36.0–46.0)
Hemoglobin: 12.7 g/dL (ref 12.0–15.0)
MCH: 27 pg (ref 26.0–34.0)
MCHC: 33.9 g/dL (ref 30.0–36.0)

## 2011-12-20 MED ORDER — ONDANSETRON HCL 4 MG PO TABS: 4.0000 mg | ORAL_TABLET | ORAL | Status: DC | PRN

## 2011-12-20 MED ORDER — EPHEDRINE 5 MG/ML INJ
10.0000 mg | INTRAVENOUS | Status: DC | PRN
  Filled 2011-12-20: qty 2
  Filled 2011-12-20: qty 4

## 2011-12-20 MED ORDER — MEASLES, MUMPS & RUBELLA VAC ~~LOC~~ INJ
0.5000 mL | INJECTION | Freq: Once | SUBCUTANEOUS | Status: DC
  Filled 2011-12-20: qty 0.5

## 2011-12-20 MED ORDER — OXYTOCIN 20 UNITS IN LACTATED RINGERS INFUSION - SIMPLE
125.0000 mL/h | Freq: Once | INTRAVENOUS | Status: AC
  Administered 2011-12-20: 999 mL/h via INTRAVENOUS

## 2011-12-20 MED ORDER — OXYCODONE-ACETAMINOPHEN 5-325 MG PO TABS: 1.0000 | ORAL_TABLET | ORAL | Status: DC | PRN

## 2011-12-20 MED ORDER — WITCH HAZEL-GLYCERIN EX PADS: 1.0000 "application " | MEDICATED_PAD | CUTANEOUS | Status: DC | PRN

## 2011-12-20 MED ORDER — PHENYLEPHRINE 40 MCG/ML (10ML) SYRINGE FOR IV PUSH (FOR BLOOD PRESSURE SUPPORT)
80.0000 ug | PREFILLED_SYRINGE | INTRAVENOUS | Status: DC | PRN
  Filled 2011-12-20: qty 5
  Filled 2011-12-20: qty 2

## 2011-12-20 MED ORDER — LIDOCAINE HCL (PF) 1 % IJ SOLN
30.0000 mL | INTRAMUSCULAR | Status: AC | PRN
  Administered 2011-12-20: 30 mL via SUBCUTANEOUS
  Filled 2011-12-20: qty 30

## 2011-12-20 MED ORDER — FERROUS SULFATE 325 (65 FE) MG PO TABS
325.0000 mg | ORAL_TABLET | Freq: Two times a day (BID) | ORAL | Status: DC
  Administered 2011-12-20 – 2011-12-22 (×4): 325 mg via ORAL
  Filled 2011-12-20 (×4): qty 1

## 2011-12-20 MED ORDER — DIBUCAINE 1 % RE OINT: 1.0000 "application " | TOPICAL_OINTMENT | RECTAL | Status: DC | PRN

## 2011-12-20 MED ORDER — LACTATED RINGERS IV SOLN: 500.0000 mL | Freq: Once | INTRAVENOUS | Status: DC

## 2011-12-20 MED ORDER — DIPHENHYDRAMINE HCL 50 MG/ML IJ SOLN: 12.5000 mg | INTRAMUSCULAR | Status: DC | PRN

## 2011-12-20 MED ORDER — MAGNESIUM HYDROXIDE 400 MG/5ML PO SUSP: 30.0000 mL | ORAL | Status: DC | PRN

## 2011-12-20 MED ORDER — MEDROXYPROGESTERONE ACETATE 150 MG/ML IM SUSP: 150.0000 mg | INTRAMUSCULAR | Status: DC | PRN

## 2011-12-20 MED ORDER — ZOLPIDEM TARTRATE 5 MG PO TABS: 5.0000 mg | ORAL_TABLET | Freq: Every evening | ORAL | Status: DC | PRN

## 2011-12-20 MED ORDER — CITRIC ACID-SODIUM CITRATE 334-500 MG/5ML PO SOLN: 30.0000 mL | ORAL | Status: DC | PRN

## 2011-12-20 MED ORDER — BENZOCAINE-MENTHOL 20-0.5 % EX AERO
1.0000 "application " | INHALATION_SPRAY | CUTANEOUS | Status: DC | PRN
  Filled 2011-12-20: qty 56

## 2011-12-20 MED ORDER — TETANUS-DIPHTH-ACELL PERTUSSIS 5-2.5-18.5 LF-MCG/0.5 IM SUSP: 0.5000 mL | Freq: Once | INTRAMUSCULAR | Status: DC

## 2011-12-20 MED ORDER — LANOLIN HYDROUS EX OINT: TOPICAL_OINTMENT | CUTANEOUS | Status: DC | PRN

## 2011-12-20 MED ORDER — EPHEDRINE 5 MG/ML INJ
10.0000 mg | INTRAVENOUS | Status: DC | PRN
  Filled 2011-12-20: qty 2

## 2011-12-20 MED ORDER — ONDANSETRON HCL 4 MG/2ML IJ SOLN
4.0000 mg | Freq: Four times a day (QID) | INTRAMUSCULAR | Status: DC | PRN
  Administered 2011-12-20: 4 mg via INTRAVENOUS
  Filled 2011-12-20: qty 2

## 2011-12-20 MED ORDER — FLEET ENEMA 7-19 GM/118ML RE ENEM: 1.0000 | ENEMA | RECTAL | Status: DC | PRN

## 2011-12-20 MED ORDER — FENTANYL 2.5 MCG/ML BUPIVACAINE 1/10 % EPIDURAL INFUSION (WH - ANES)
14.0000 mL/h | INTRAMUSCULAR | Status: DC
  Administered 2011-12-20 (×2): 14 mL/h via EPIDURAL
  Filled 2011-12-20 (×3): qty 60

## 2011-12-20 MED ORDER — OXYTOCIN BOLUS FROM INFUSION
500.0000 mL | Freq: Once | INTRAVENOUS | Status: DC
  Filled 2011-12-20: qty 500
  Filled 2011-12-20: qty 1000

## 2011-12-20 MED ORDER — DIPHENHYDRAMINE HCL 25 MG PO CAPS: 25.0000 mg | ORAL_CAPSULE | Freq: Four times a day (QID) | ORAL | Status: DC | PRN

## 2011-12-20 MED ORDER — IBUPROFEN 600 MG PO TABS: 600.0000 mg | ORAL_TABLET | Freq: Four times a day (QID) | ORAL | Status: DC | PRN

## 2011-12-20 MED ORDER — SENNOSIDES-DOCUSATE SODIUM 8.6-50 MG PO TABS
2.0000 | ORAL_TABLET | Freq: Every day | ORAL | Status: DC
  Administered 2011-12-20: 2 via ORAL

## 2011-12-20 MED ORDER — FENTANYL 2.5 MCG/ML BUPIVACAINE 1/10 % EPIDURAL INFUSION (WH - ANES)
INTRAMUSCULAR | Status: DC | PRN
  Administered 2011-12-20: 14 mL/h via EPIDURAL

## 2011-12-20 MED ORDER — PHENYLEPHRINE 40 MCG/ML (10ML) SYRINGE FOR IV PUSH (FOR BLOOD PRESSURE SUPPORT)
80.0000 ug | PREFILLED_SYRINGE | INTRAVENOUS | Status: DC | PRN
  Filled 2011-12-20: qty 2

## 2011-12-20 MED ORDER — LACTATED RINGERS IV SOLN: 500.0000 mL | INTRAVENOUS | Status: DC | PRN

## 2011-12-20 MED ORDER — PRENATAL MULTIVITAMIN CH
1.0000 | ORAL_TABLET | Freq: Every day | ORAL | Status: DC
  Administered 2011-12-21 – 2011-12-22 (×2): 1 via ORAL
  Filled 2011-12-20 (×2): qty 1

## 2011-12-20 MED ORDER — IBUPROFEN 600 MG PO TABS
600.0000 mg | ORAL_TABLET | Freq: Four times a day (QID) | ORAL | Status: DC
  Administered 2011-12-20 – 2011-12-22 (×9): 600 mg via ORAL
  Filled 2011-12-20 (×9): qty 1

## 2011-12-20 MED ORDER — LACTATED RINGERS IV SOLN: INTRAVENOUS | Status: DC

## 2011-12-20 MED ORDER — ACETAMINOPHEN 325 MG PO TABS: 650.0000 mg | ORAL_TABLET | ORAL | Status: DC | PRN

## 2011-12-20 MED ORDER — ONDANSETRON HCL 4 MG/2ML IJ SOLN: 4.0000 mg | INTRAMUSCULAR | Status: DC | PRN

## 2011-12-20 MED ORDER — BUTORPHANOL TARTRATE 2 MG/ML IJ SOLN: 2.0000 mg | INTRAMUSCULAR | Status: DC | PRN

## 2011-12-20 NOTE — Progress Notes (Signed)
MD made aware of pt status: SVE, FHT tracing, uterine contraction pattern. Will continue to monitor.

## 2011-12-20 NOTE — Progress Notes (Signed)
MD made aware of pt status: SVE, station, pressure per pt, FHT tracing, uterine contraction pattern, bulging bag of waters. Encouraged to call when pt is ready. Will continue to monitor.

## 2011-12-20 NOTE — Progress Notes (Signed)
MD made aware of pt status:FHT tracing, uterine contraction pattern, SVE. Will continue to monitor. MD will be at bedside.

## 2011-12-20 NOTE — H&P (Signed)
Ariana Bentley is a 21 y.o. female presenting for contractions. Maternal Medical History:  Reason for admission: Reason for admission: contractions.  Contractions: Frequency: regular.    Prenatal complications: no prenatal complications   OB History    Grav Para Term Preterm Abortions TAB SAB Ect Mult Living   1 0 0 0 0 0 0 0 0 0      Past Medical History  Diagnosis Date  . Asthma   . Trichomonas   . Chlamydia   . Pseudotumor cerebri   . Headache    Past Surgical History  Procedure Date  . Lumbar puncture    Family History: family history is negative for Anesthesia problems. Social History:  reports that she has been smoking.  She does not have any smokeless tobacco history on file. She reports that she uses illicit drugs (Marijuana). She reports that she does not drink alcohol.  Review of Systems  Constitutional: Negative for fever.  Eyes: Negative for blurred vision.  Respiratory: Negative for shortness of breath.   Gastrointestinal: Negative for vomiting.  Skin: Negative for rash.  Neurological: Negative for headaches.    Dilation: 10 Effacement (%): 100 Station: 0 Exam by:: KFieldsRN Blood pressure 104/62, pulse 96, temperature 99.4 F (37.4 C), temperature source Oral, resp. rate 18, height 5\' 7"  (1.702 m), weight 110.224 kg (243 lb), last menstrual period 03/30/2011. Maternal Exam:  Uterine Assessment: Contraction frequency is regular.   Abdomen: Fetal presentation: vertex  Introitus: not evaluated.   Cervix: Cervix evaluated by digital exam.     Fetal Exam Fetal Monitor Review: Variability: moderate (6-25 bpm).   Pattern: accelerations present and no decelerations.    Fetal State Assessment: Category I - tracings are normal.     Physical Exam  Constitutional: She appears well-developed.  HENT:  Head: Normocephalic.  Neck: Neck supple. No thyromegaly present.  Cardiovascular: Normal rate and regular rhythm.   Respiratory: Breath sounds normal.    GI: Soft. Bowel sounds are normal.  Skin: No rash noted.    Prenatal labs: ABO, Rh: --/--/O POS (10/03 1349) Antibody: Negative (10/17 0000) Rubella: Immune (10/17 0000) RPR: Nonreactive (10/17 0000)  HBsAg: Negative (10/17 0000)  HIV: Non-reactive (10/17 0000)  GBS: Negative (05/10 0000)   Assessment/Plan: Nullipara w/an IUP @[redacted]w[redacted]d  , active labor, Category 1 FHT. Admit, anticipate an NSVD   JACKSON-MOORE,Albert Devaul A 12/20/2011, 8:38 AM

## 2011-12-20 NOTE — Anesthesia Preprocedure Evaluation (Signed)
Anesthesia Evaluation  Patient identified by MRN, date of birth, ID band Patient awake    Reviewed: Allergy & Precautions, H&P , Patient's Chart, lab work & pertinent test results  Airway Mallampati: II  TM Distance: >3 FB Neck ROM: full    Dental  (+) Teeth Intact   Pulmonary asthma ,    breath sounds clear to auscultation       Cardiovascular  Rhythm:regular Rate:Normal     Neuro/Psych    GI/Hepatic   Endo/Other    Renal/GU      Musculoskeletal   Abdominal   Peds  Hematology   Anesthesia Other Findings       Reproductive/Obstetrics (+) Pregnancy                            Anesthesia Physical Anesthesia Plan  ASA: III  Anesthesia Plan: Epidural   Post-op Pain Management:    Induction:   Airway Management Planned:   Additional Equipment:   Intra-op Plan:   Post-operative Plan:   Informed Consent: I have reviewed the patients History and Physical, chart, labs and discussed the procedure including the risks, benefits and alternatives for the proposed anesthesia with the patient or authorized representative who has indicated his/her understanding and acceptance.   Dental Advisory Given  Plan Discussed with:   Anesthesia Plan Comments: (Labs checked- platelets confirmed with RN in room. Fetal heart tracing, per RN, reported to be stable enough for sitting procedure. Discussed epidural, and patient consents to the procedure:  included risk of possible headache,backache, failed block, allergic reaction, and nerve injury. This patient was asked if she had any questions or concerns before the procedure started.)        Anesthesia Quick Evaluation  

## 2011-12-20 NOTE — Anesthesia Procedure Notes (Signed)
Epidural Patient location during procedure: OB  Preanesthetic Checklist Completed: patient identified, site marked, surgical consent, pre-op evaluation, timeout performed, IV checked, risks and benefits discussed and monitors and equipment checked  Epidural Patient position: sitting Prep: site prepped and draped and DuraPrep Patient monitoring: continuous pulse ox and blood pressure Approach: midline Injection technique: LOR air  Needle:  Needle type: Tuohy  Needle gauge: 17 G Needle length: 9 cm Needle insertion depth: 9 cm Catheter type: closed end flexible Catheter size: 19 Gauge Test dose: negative  Assessment Events: blood not aspirated, injection not painful, no injection resistance, negative IV test and no paresthesia  Additional Notes Dosing of Epidural:  1st dose, through needle ............................................. epi 1:200K + Xylocaine 40 mg  2nd dose, through catheter, after waiting 3 minutes.....epi 1:200K + Xylocaine 40 mg  3rd dose, through catheter after waiting 3 minutes .............................Marcaine   4mg   ( mg Marcaine are expressed as equivilent  cc's medication removed from the 0.1%Bupiv / fentanyl syringe from L&D pump)  ( 2% Xylo charted as a single dose in Epic Meds for ease of charting; actual dosing was fractionated as above, for saftey's sake)  As each dose occurred, patient was free of IV sx; and patient exhibited no evidence of SA injection.  Patient is more comfortable after epidural dosed. Please see RN's note for documentation of vital signs,and FHR which are stable.    

## 2011-12-20 NOTE — Anesthesia Postprocedure Evaluation (Signed)
  Anesthesia Post-op Note  Patient: Ariana Bentley  Procedure(s) Performed: * No procedures listed *  Patient Location: PACU and Mother/Baby  Anesthesia Type: Epidural  Level of Consciousness: awake, alert  and oriented  Airway and Oxygen Therapy: Patient Spontanous Breathing    Post-op Assessment: Patient's Cardiovascular Status Stable and Respiratory Function Stable  Post-op Vital Signs: stable  Complications: No apparent anesthesia complications

## 2011-12-21 MED ORDER — NORETHINDRONE 0.35 MG PO TABS: 1.0000 | ORAL_TABLET | Freq: Every day | ORAL | Status: DC

## 2011-12-21 NOTE — Discharge Summary (Addendum)
Obstetric Discharge Summary Reason for Admission: onset of labor Prenatal Procedures: none Intrapartum Procedures: spontaneous vaginal delivery Postpartum Procedures: none Complications-Operative and Postpartum: none Hemoglobin  Date Value Range Status  12/20/2011 12.7  12.0-15.0 (g/dL) Final     HCT  Date Value Range Status  12/20/2011 37.5  36.0-46.0 (%) Final    Physical Exam:  General: alert Lochia: appropriate Uterine Fundus: firm Incision: n/a DVT Evaluation: No evidence of DVT seen on physical exam.  Discharge Diagnoses: Term Pregnancy-delivered  Discharge Information: Date: 12/22/11 Activity: pelvic rest Diet: routine Medications: PNV, Ibuprofen and Micronor Condition: stable Instructions: see above Discharge to: home Follow-up Information    Follow up with MARSHALL,BERNARD A, MD. Schedule an appointment as soon as possible for a visit in 6 weeks.   Contact information:   [address] [place] [address] [phone]          Newborn Data: Live born female  Birth Weight: 5 lb 15.2 oz (2700 g) APGAR: 9, 9  Home with mother.  JACKSON-MOORE,Dennison Mcdaid A 12/21/2011, 11:49 AM

## 2011-12-21 NOTE — Discharge Instructions (Signed)

## 2011-12-21 NOTE — Progress Notes (Signed)
CSW noted consult for MJ use, awaiting UDS for infant and will complete full assessment. 

## 2011-12-22 NOTE — Progress Notes (Signed)
Post Partum Day #2 S/P:spontaneous vaginal  RH status/Rubella reviewed.  Feeding: unknown Subjective: No HA, SOB, CP, F/C, breast symptoms: no. Normal vaginal bleeding, no clots.     Objective:  Blood pressure 115/81, pulse 80, temperature 98.4 F (36.9 C), temperature source Oral, resp. rate 20, height 5\' 7"  (1.702 m), weight 110.224 kg (243 lb), last menstrual period 03/30/2011, unknown if currently breastfeeding.   Physical Exam:  General: alert Lochia: appropriate Uterine Fundus: firm DVT Evaluation: No evidence of DVT seen on physical exam. Ext: No c/c/e  Basename 12/20/11 0141  HGB 12.7  HCT 37.5    Assessment/Plan: 21 y.o.  PPD # 2 .  normal postpartum exam Continue current postpartum care D/C home   LOS: 2 days   JACKSON-MOORE,Latresha Yahr A 12/22/2011, 11:29 AM

## 2011-12-22 NOTE — Clinical Social Work Maternal (Signed)
    Clinical Social Work Department PSYCHOSOCIAL ASSESSMENT - MATERNAL/CHILD 12/22/2011  Patient:  [facility]  Account Number:  [id number]  Admit Date:  12/20/2011  Marjo Bicker Name:   Blima Singer [facility]    Clinical Social Worker:  Andy Gauss   Date/Time:  12/22/2011 12:28 PM  Date Referred:  12/22/2011   Referral source  CN     Referred reason  Substance Abuse   Other referral source:    I:  FAMILY / HOME ENVIRONMENT Child's legal guardian:  PARENT  Guardian - Name Guardian - Age Guardian - Address  [place] [address]; [place], [place] [address]  Jearld Shines 24    Other household support members/support persons Other support:   FOB's mother and her mother    II  PSYCHOSOCIAL DATA Information Source:  Patient Interview  Event organiser Employment:   Surveyor, quantity resources:  [employer] If Medicaid - County:  [place] Other  WIC  Chemical engineer / Grade:  Armed forces operational officer / Statistician / Early Interventions:  Cultural issues impacting care:    III  STRENGTHS Strengths  Adequate Resources  Home prepared for Child (including basic supplies)  Supportive family/friends   Strength comment:    IV  RISK FACTORS AND CURRENT PROBLEMS Current Problem:  None   Risk Factor & Current Problem Patient Issue Family Issue Risk Factor / Current Problem Comment   Y N Hx of MJ use    V  SOCIAL WORK ASSESSMENT Pt admits to smoking MJ, "once a month," prior to pregnancy confirmation at 4 weeks.  Once pregnancy was confirmed, stopped smoking immediately but continued to be around it "all the time."  She denies other illegal substance use. Sw explained hospital drug testing policy.  UDS is negative, meconium results are pending.  Pt has all the necessary infant supplies and good family support.  Sw observed pt bonding well with the infant and appears appropriate.  Sw will follow up with drug screen results and make a  referral if needed.      VI SOCIAL WORK PLAN Social Work Plan  No Further Intervention Required / No Barriers to Discharge   Type of pt/family education:   If child protective services report - county:   If child protective services report - date:   Information/referral to community resources comment:   Other social work plan:

## 2011-12-22 NOTE — Progress Notes (Signed)
UR chart review completed.  

## 2012-01-16 ENCOUNTER — Inpatient Hospital Stay (HOSPITAL_COMMUNITY): Payer: Medicaid Other

## 2012-01-16 ENCOUNTER — Inpatient Hospital Stay (HOSPITAL_COMMUNITY)
Admission: AD | Admit: 2012-01-16 | Discharge: 2012-01-16 | Disposition: A | Discharge status: Home / Self Care | Payer: Medicaid Other | Source: Ambulatory Visit | Attending: Obstetrics | Admitting: Obstetrics

## 2012-01-16 DIAGNOSIS — N946 Dysmenorrhea, unspecified: Secondary | ICD-10-CM | POA: Insufficient documentation

## 2012-01-16 LAB — CBC
Hemoglobin: 12.5 g/dL (ref 12.0–15.0)
MCH: 26.7 pg (ref 26.0–34.0)
MCHC: 33.1 g/dL (ref 30.0–36.0)
Platelets: 179 10*3/uL (ref 150–400)

## 2012-01-16 NOTE — MAU Provider Note (Signed)
History     CSN: [id number]  Arrival date & time 01/16/12  0335   None     Chief Complaint  Patient presents with  . Vaginal Bleeding   HPI Ariana Bentley is a 21 y.o. female who presents to MAU for vaginal bleeding. SVD 12/20/11. Stopped bleeding after delivery and then started bleeding 6/15 and has continued. Passing clots for the past few days. Low abdominal cramping. The history was provided by the patient.  Past Medical History  Diagnosis Date  . Asthma   . Trichomonas   . Chlamydia   . Pseudotumor cerebri   . Headache     Past Surgical History  Procedure Date  . Lumbar puncture     Family History  Problem Relation Age of Onset  . Anesthesia problems Neg Hx     History  Substance Use Topics  . Smoking status: Current Some Day Smoker  . Smokeless tobacco: Not on file  . Alcohol Use: No     Marijuana stopped a "few" months ago    OB History    Grav Para Term Preterm Abortions TAB SAB Ect Mult Living   1 1 1  0 0 0 0 0 0 1      Review of Systems: As stated in HPI  Allergies  Shellfish allergy and Doxycycline  Home Medications  No current outpatient prescriptions on file.  BP 114/78  Pulse 75  Resp 18  Ht 5\' 5"  (1.651 m)  Wt 219 lb (99.338 kg)  BMI 36.44 kg/m2  SpO2 100%  LMP 01/10/2012  Physical Exam  Nursing note and vitals reviewed. Constitutional: She is oriented to person, place, and time. She appears well-developed and well-nourished. No distress.  HENT:  Head: Normocephalic.  Eyes: EOM are normal.  Neck: Neck supple.  Cardiovascular: Normal rate.   Pulmonary/Chest: Effort normal.  Abdominal: Soft. There is no tenderness.  Genitourinary:       External genitalia without lesions. Moderate blood vaginal vault. Cervix with clot noted. Mild CMT, no adnexal pain or mass palpated. Uterus without palpable enlargement.  Musculoskeletal: Normal range of motion.  Neurological: She is alert and oriented to person, place, and time. No cranial nerve  deficit.  Skin: Skin is warm and dry.  Psychiatric: She has a normal mood and affect. Her behavior is normal. Judgment and thought content normal.    ED Course  Procedures: ultrasound tonight is normal  Assessment: Dysmenorrhea  Plan:  Motrin   Follow up with Dr. Gaynell Face MDM

## 2012-01-16 NOTE — MAU Note (Signed)
Pt reports s/p SVD 12/20/2011 without problems, stopped bleeding postpartum but started bleeding again on 06/15 for the last 2 days has been changing pad q 1 hour with lots of "big clots" , pt denies pain

## 2012-07-14 ENCOUNTER — Encounter (HOSPITAL_COMMUNITY): Payer: Self-pay | Admitting: *Deleted

## 2012-07-14 ENCOUNTER — Emergency Department (INDEPENDENT_AMBULATORY_CARE_PROVIDER_SITE_OTHER)
Admission: EM | Admit: 2012-07-14 | Discharge: 2012-07-14 | Disposition: A | Discharge status: Home / Self Care | Payer: Medicaid Other | Source: Home / Self Care

## 2012-07-14 DIAGNOSIS — J069 Acute upper respiratory infection, unspecified: Secondary | ICD-10-CM

## 2012-07-14 MED ORDER — PHENYLEPHRINE-CHLORPHEN-DM 10-4-12.5 MG/5ML PO LIQD: 5.0000 mL | ORAL | Status: DC | PRN

## 2012-07-14 NOTE — ED Notes (Signed)
C/O productive cough, fevers up to 101, facial and head pressure x 1.5 wks.  Has been taking IBU - last dose @ 1400.

## 2012-07-14 NOTE — ED Provider Notes (Signed)
History     CSN: [id number]  Arrival date & time 07/14/12  1647   None     Chief Complaint  Patient presents with  . Cough  . Facial Pain    (Consider location/radiation/quality/duration/timing/severity/associated sxs/prior treatment) HPI Comments: 21 year old female presents with complaints of sneezing, cough, pressure behind the eyes and face, nasal congestion and drainage. Her only medication is ibuprofen and this has not alleviated her symptoms. Although it does help with facial pain and headache. She denies fever.   Past Medical History  Diagnosis Date  . Asthma   . Trichomonas   . Chlamydia   . Pseudotumor cerebri   . Headache     Past Surgical History  Procedure Date  . Lumbar puncture     Family History  Problem Relation Age of Onset  . Anesthesia problems Neg Hx     History  Substance Use Topics  . Smoking status: Former Games developer  . Smokeless tobacco: Not on file  . Alcohol Use: No     Comment: Marijuana stopped a "few" months ago    OB History    Grav Para Term Preterm Abortions TAB SAB Ect Mult Living   1 1 1  0 0 0 0 0 0 1      Review of Systems  Constitutional: Negative for fever, chills, activity change, appetite change and fatigue.  HENT: Positive for congestion, rhinorrhea, postnasal drip and sinus pressure. Negative for facial swelling, trouble swallowing, neck pain and neck stiffness.   Eyes: Negative.   Respiratory: Negative.   Cardiovascular: Negative.   Gastrointestinal: Negative.   Skin: Negative for pallor and rash.  Neurological: Negative.     Allergies  Shellfish allergy and Doxycycline  Home Medications   Current Outpatient Rx  Name  Route  Sig  Dispense  Refill  . ACETAMINOPHEN 500 MG PO TABS   Oral   Take 500 mg by mouth every 6 (six) hours as needed. Head aches         . NORETHINDRONE 0.35 MG PO TABS   Oral   Take 1 tablet (0.35 mg total) by mouth daily. Second Sunday start   28 tablet   11   .  PHENYLEPHRINE-CHLORPHEN-DM 05-01-11.5 MG/5ML PO LIQD   Oral   Take 5 mLs by mouth every 4 (four) hours as needed.   120 mL   0   . PRENATAL MULTIVITAMIN CH   Oral   Take 1 tablet by mouth daily.           BP 102/68  Pulse 115  Temp 98.9 F (37.2 C) (Oral)  Resp 20  SpO2 98%  LMP 06/16/2012  Breastfeeding? No  Physical Exam  Constitutional: She is oriented to person, place, and time. She appears well-developed and well-nourished. No distress.  HENT:  Mouth/Throat: No oropharyngeal exudate.       Bilateral TMs are normal Oropharynx is clear without exudates .  Eyes: Conjunctivae normal and EOM are normal.  Neck: Normal range of motion. Neck supple.  Cardiovascular: Normal rate and regular rhythm.   Pulmonary/Chest: Effort normal and breath sounds normal. No respiratory distress.  Musculoskeletal: Normal range of motion. She exhibits no edema.  Lymphadenopathy:    She has no cervical adenopathy.  Neurological: She is alert and oriented to person, place, and time.  Skin: Skin is warm and dry. No rash noted.  Psychiatric: She has a normal mood and affect.    ED Course  Procedures (including critical care time)  Labs Reviewed -  No data to display No results found.   1. URI (upper respiratory infection)       MDM  Patient appears generally well and is stable discharge. She has not been taking any medications to help with symptoms. Norell CS 1 teaspoon every 4 hours when necessary cough congestion and drainage. May continue taking ibuprofen for discomfort or fever. Plenty of fluids and stay well hydrated Oral your doctor as needed next week.        Hayden Rasmussen, NP 07/14/12 [phone]

## 2012-07-16 NOTE — ED Provider Notes (Signed)
Medical screening examination/treatment/procedure(s) were performed by resident physician or non-physician practitioner and as supervising physician I was immediately available for consultation/collaboration.   Shahrzad Koble DOUGLAS MD.    Egor Fullilove D Tonique Mendonca, MD 07/16/12 1130 

## 2012-10-09 ENCOUNTER — Encounter (HOSPITAL_COMMUNITY): Payer: Self-pay | Admitting: Emergency Medicine

## 2012-10-09 ENCOUNTER — Emergency Department (HOSPITAL_COMMUNITY)
Admission: EM | Admit: 2012-10-09 | Discharge: 2012-10-09 | Disposition: A | Discharge status: Home / Self Care | Payer: Medicaid Other | Source: Home / Self Care | Attending: Emergency Medicine | Admitting: Emergency Medicine

## 2012-10-09 DIAGNOSIS — J019 Acute sinusitis, unspecified: Secondary | ICD-10-CM

## 2012-10-09 DIAGNOSIS — J309 Allergic rhinitis, unspecified: Secondary | ICD-10-CM

## 2012-10-09 MED ORDER — LEVOCETIRIZINE DIHYDROCHLORIDE 5 MG PO TABS: 5.0000 mg | ORAL_TABLET | Freq: Every evening | ORAL | Status: DC

## 2012-10-09 MED ORDER — CEFDINIR 300 MG PO CAPS: 300.0000 mg | ORAL_CAPSULE | Freq: Two times a day (BID) | ORAL | Status: DC

## 2012-10-09 MED ORDER — FLUTICASONE PROPIONATE 50 MCG/ACT NA SUSP: 2.0000 | Freq: Every day | NASAL | Status: DC

## 2012-10-09 MED ORDER — OLOPATADINE HCL 0.1 % OP SOLN: 1.0000 [drp] | Freq: Two times a day (BID) | OPHTHALMIC | Status: DC

## 2012-10-09 NOTE — ED Notes (Signed)
Pt c/o sore throat, fatigue, headache, itchy water eyes. And sneezing. Pt denies fever, n/v/d. Has not tried any otc meds for treatment.

## 2012-10-09 NOTE — ED Provider Notes (Addendum)
Chief Complaint:   Chief Complaint  Patient presents with  . Allergies    sore throat,headache, itcy eyes, fatigue, productive cough    History of Present Illness:   Ariana Bentley is a 21 year old female who had a two-week history of sore throat, feeling drowsy, sneezing, nasal congestion with green drainage, and itchy, watery eyes. She has a history of allergic rhinitis and takes cetirizine for that. She denies fever, chills, wheezing, headache, or GI symptoms.  Review of Systems:  Other than noted above, the patient denies any of the following symptoms. Systemic:  No fever, chills, sweats, fatigue, myalgias, headache, or anorexia. Eye:  No redness, itching, watering, pain or drainage. ENT:  No earache, ear congestion, nasal congestion, sneezing, nasal itching, rhinorrhea, sinus pressure, sinus pain, post nasal drip, or sore throat. Lungs:  No cough, sputum production, wheezing, shortness of breath, or chest pain. Skin:  No rash or itching.  PMFSH:  Past medical history, family history, social history, meds, and allergies were reviewed.  No history of asthma. No use of tobacco. She has no allergies, takes no medications, and has a history of asthma and pseudotumor cerebri.  Physical Exam:   Vital signs:  BP 118/55  Pulse 85  Temp(Src) 98.5 F (36.9 C) (Oral)  Resp 18  SpO2 100%  LMP 09/13/2012  Breastfeeding? No General:  Alert, in no distress. Eye:  No conjunctival injection or drainage. Lids were normal. ENT:  TMs and canals were normal, without erythema or inflammation.  Nasal mucosa was normal.  Mucous membranes were moist.  Pharynx was clear, without exudate or drainage.  There were no oral ulcerations or lesions. Neck:  Supple, no adenopathy, tenderness or mass. Lungs:  No respiratory distress.  Lungs were clear to auscultation, without wheezes, rales or rhonchi.  Breath sounds were clear and equal bilaterally. Heart:  Regular rhythm, without gallops, murmers or rubs. Skin:   Clear, warm, and dry, without rash or lesions.  Assessment:  The primary encounter diagnosis was Allergic rhinitis. A diagnosis of Acute sinusitis was also pertinent to this visit.  I think she has underlying allergy with an acute sinus infection. She did not want to take amoxicillin, so she was given cefdinir instead.  Plan:   1.  The following meds were prescribed:   Discharge Medication List as of 10/09/2012  1:47 PM    START taking these medications   Details  cefdinir (OMNICEF) 300 MG capsule Take 1 capsule (300 mg total) by mouth 2 (two) times daily., Starting 10/09/2012, Until Discontinued, Normal    fluticasone (FLONASE) 50 MCG/ACT nasal spray Place 2 sprays into the nose daily., Starting 10/09/2012, Until Discontinued, Normal    levocetirizine (XYZAL) 5 MG tablet Take 1 tablet (5 mg total) by mouth every evening., Starting 10/09/2012, Until Discontinued, Normal    olopatadine (PATANOL) 0.1 % ophthalmic solution Place 1 drop into both eyes 2 (two) times daily., Starting 10/09/2012, Until Discontinued, Normal       2.  The patient was instructed in symptomatic care and handouts were given. 3.  The patient was told to return if becoming worse in any way, if no better in 3 or 4 days, and given some red flag symptoms such as fever, chills, difficulty breathing, or chest pain that would indicate earlier return. 4.  The patient was also instructed in allergen avoidance.  Follow up:  The patient was told to follow up with a primary care physician as soon as possible.    Reuben Likes,  MD 10/09/12 2002  Addendum: Her insurance would not pay for the Patanol or Xyzal, but will pay for Pataday and Zyrtec, so we will prescribe these instead.  Reuben Likes, MD 10/13/12 [phone]

## 2012-10-13 ENCOUNTER — Telehealth (HOSPITAL_COMMUNITY): Payer: Self-pay | Admitting: *Deleted

## 2012-10-13 MED ORDER — CETIRIZINE HCL 10 MG PO TABS: 10.0000 mg | ORAL_TABLET | Freq: Every day | ORAL | Status: DC

## 2012-10-13 MED ORDER — OLOPATADINE HCL 0.2 % OP SOLN: OPHTHALMIC | Status: DC

## 2012-10-13 NOTE — ED Notes (Signed)
[facility] pharmacy called and said the Patanol and Xyzal both require prior authorization. Medicaid will cover Pataday and Cetirazine.  Discussed with Dr. Lorenz Coaster and he said he would change them. Vassie Moselle 10/13/2012

## 2012-12-06 ENCOUNTER — Emergency Department (HOSPITAL_COMMUNITY)
Admission: EM | Admit: 2012-12-06 | Discharge: 2012-12-06 | Disposition: A | Discharge status: Home / Self Care | Payer: Medicaid Other | Source: Home / Self Care

## 2012-12-06 ENCOUNTER — Encounter (HOSPITAL_COMMUNITY): Payer: Self-pay | Admitting: Emergency Medicine

## 2012-12-06 DIAGNOSIS — H109 Unspecified conjunctivitis: Secondary | ICD-10-CM

## 2012-12-06 MED ORDER — TOBRAMYCIN-DEXAMETHASONE 0.3-0.1 % OP SUSP: 1.0000 [drp] | OPHTHALMIC | Status: DC

## 2012-12-06 NOTE — ED Provider Notes (Signed)
Medical screening examination/treatment/procedure(s) were performed by resident physician or non-physician practitioner and as supervising physician I was immediately available for consultation/collaboration.   Antionio Negron DOUGLAS MD.   Topher Buenaventura D Rosalynn Sergent, MD 12/06/12 1913 

## 2012-12-06 NOTE — ED Notes (Signed)
Pt c/o poss pink eye on right eye onset this am Woke up w/crust on right eye, swelling and itching Denies: f/v/n/d, blurry vision, cold sx  She is alert and oriented w/no signs of acute distress.

## 2012-12-06 NOTE — ED Provider Notes (Signed)
History     CSN: [id number]  Arrival date & time 12/06/12  1809   None     Chief Complaint  Patient presents with  . Conjunctivitis    (Consider location/radiation/quality/duration/timing/severity/associated sxs/prior treatment) HPI Comments: 22 year old female presents with right eye discomfort, soreness and itching. This started this morning. She has a history of allergies in conjunctivitis previously due to allergies. She thinks this is pink. She denies having associated allergy symptoms.   Past Medical History  Diagnosis Date  . Asthma   . Trichomonas   . Chlamydia   . Pseudotumor cerebri   . Headache     Past Surgical History  Procedure Laterality Date  . Lumbar puncture      Family History  Problem Relation Age of Onset  . Anesthesia problems Neg Hx     History  Substance Use Topics  . Smoking status: Former Games developer  . Smokeless tobacco: Not on file  . Alcohol Use: No     Comment: Marijuana stopped a "few" months ago    OB History   Grav Para Term Preterm Abortions TAB SAB Ect Mult Living   1 1 1  0 0 0 0 0 0 1      Review of Systems  Constitutional: Negative.   HENT: Negative for sore throat, facial swelling, rhinorrhea, mouth sores, neck pain, neck stiffness, postnasal drip and ear discharge.   Eyes: Positive for pain, redness and itching. Negative for discharge.  Respiratory: Negative for shortness of breath.   Gastrointestinal: Negative.   Genitourinary: Negative.   Musculoskeletal: Negative.   Psychiatric/Behavioral: Negative.     Allergies  Shellfish allergy and Doxycycline  Home Medications   Current Outpatient Rx  Name  Route  Sig  Dispense  Refill  . acetaminophen (TYLENOL) 500 MG tablet   Oral   Take 500 mg by mouth every 6 (six) hours as needed. Head aches         . cefdinir (OMNICEF) 300 MG capsule   Oral   Take 1 capsule (300 mg total) by mouth 2 (two) times daily.   20 capsule   0   . cetirizine (ZYRTEC ALLERGY) 10 MG  tablet   Oral   Take 1 tablet (10 mg total) by mouth daily.   30 tablet   12   . fluticasone (FLONASE) 50 MCG/ACT nasal spray   Nasal   Place 2 sprays into the nose daily.   16 g   0   . norethindrone (ORTHO MICRONOR) 0.35 MG tablet   Oral   Take 1 tablet (0.35 mg total) by mouth daily. Second Sunday start   28 tablet   11   . Olopatadine HCl (PATADAY) 0.2 % SOLN      1 drop in both eyes once daily for allergies.   2.5 mL   12   . Phenylephrine-Chlorphen-DM 05-01-11.5 MG/5ML LIQD   Oral   Take 5 mLs by mouth every 4 (four) hours as needed.   120 mL   0   . Prenatal Vit-Fe Fumarate-FA (PRENATAL MULTIVITAMIN) TABS   Oral   Take 1 tablet by mouth daily.         Marland Kitchen tobramycin-dexamethasone (TOBRADEX) ophthalmic solution   Right Eye   Place 1 drop into the right eye every 4 (four) hours while awake.   5 mL   0     BP 113/78  Pulse 84  Temp(Src) 99.8 F (37.7 C) (Oral)  Resp 16  SpO2 97%  LMP 11/11/2012  Breastfeeding? No  Physical Exam  Nursing note and vitals reviewed. Constitutional: She is oriented to person, place, and time. She appears well-developed and well-nourished. No distress.  HENT:  Mouth/Throat: Oropharynx is clear and moist. No oropharyngeal exudate.  Eyes: EOM are normal. Pupils are equal, round, and reactive to light.  Upper and lower lid conjunctivitis. Mild injection of the right sclera. No drainage.  Neck: Normal range of motion. Neck supple.  Cardiovascular: Normal rate.   Pulmonary/Chest: Effort normal.  Lymphadenopathy:    She has no cervical adenopathy.  Neurological: She is alert and oriented to person, place, and time. She exhibits normal muscle tone.  Skin: Skin is warm and dry.  Psychiatric: She has a normal mood and affect.    ED Course  Procedures (including critical care time)  Labs Reviewed - No data to display No results found.   1. Conjunctivitis       MDM  As the conjunctivitis is allergic versus viral  however will prescribe TobraDex to help with inflammation and to prevent any secondary bacterial infection. Is to followup with her primary care doctor is listed on the Medicaid card.        Hayden Rasmussen, NP 12/06/12 [phone]

## 2012-12-09 ENCOUNTER — Encounter (HOSPITAL_COMMUNITY): Payer: Self-pay | Admitting: Emergency Medicine

## 2012-12-09 ENCOUNTER — Emergency Department (HOSPITAL_COMMUNITY)
Admission: EM | Admit: 2012-12-09 | Discharge: 2012-12-09 | Disposition: A | Discharge status: Home / Self Care | Payer: Medicaid Other | Attending: Emergency Medicine | Admitting: Emergency Medicine

## 2012-12-09 DIAGNOSIS — Z87891 Personal history of nicotine dependence: Secondary | ICD-10-CM | POA: Insufficient documentation

## 2012-12-09 DIAGNOSIS — H109 Unspecified conjunctivitis: Secondary | ICD-10-CM | POA: Insufficient documentation

## 2012-12-09 DIAGNOSIS — H579 Unspecified disorder of eye and adnexa: Secondary | ICD-10-CM | POA: Insufficient documentation

## 2012-12-09 DIAGNOSIS — Z8669 Personal history of other diseases of the nervous system and sense organs: Secondary | ICD-10-CM | POA: Insufficient documentation

## 2012-12-09 DIAGNOSIS — Z8619 Personal history of other infectious and parasitic diseases: Secondary | ICD-10-CM | POA: Insufficient documentation

## 2012-12-09 DIAGNOSIS — J45909 Unspecified asthma, uncomplicated: Secondary | ICD-10-CM | POA: Insufficient documentation

## 2012-12-09 MED ORDER — POLYMYXIN B-TRIMETHOPRIM 10000-0.1 UNIT/ML-% OP SOLN: 1.0000 [drp] | OPHTHALMIC | Status: DC

## 2012-12-09 NOTE — ED Provider Notes (Signed)
Medical screening examination/treatment/procedure(s) were performed by non-physician practitioner and as supervising physician I was immediately available for consultation/collaboration.   Carleene Cooper III, MD 12/09/12 [phone]

## 2012-12-09 NOTE — ED Provider Notes (Signed)
History     CSN: [id number]  Arrival date & time 12/09/12  1315   First MD Initiated Contact with Patient 12/09/12 1338      No chief complaint on file.   (Consider location/radiation/quality/duration/timing/severity/associated sxs/prior treatment) HPI Comments: Patient presents with a chief complaint of redness of both eyes.  She reports that her symptoms have been present for the past 3 days.  She reports that symptoms initially started in the left eye, but has now moved to the right eye.  She denies pain, but reports that her eyes feel irritated.  She reports that she has noticed yellowish colored discharge from both eyes.  She also reports that she has crusting from her eyes and that her eyes are matted shut in the morning.  She denies fever or chills.  Denies vision changes.  Denies in foreign body in the eye.  Denies photophobia.  She was seen in the Urgent Care three days ago for the same and was prescribed Tobradex, which she reports that she is taking.  However, she feels that symptoms are worsening and would like a different prescription.  She reports that she does not wear contact lenses.  The history is provided by the patient.    Past Medical History  Diagnosis Date  . Asthma   . Trichomonas   . Chlamydia   . Pseudotumor cerebri   . Headache     Past Surgical History  Procedure Laterality Date  . Lumbar puncture      Family History  Problem Relation Age of Onset  . Anesthesia problems Neg Hx     History  Substance Use Topics  . Smoking status: Former Games developer  . Smokeless tobacco: Not on file  . Alcohol Use: No     Comment: Marijuana stopped a "few" months ago    OB History   Grav Para Term Preterm Abortions TAB SAB Ect Mult Living   1 1 1  0 0 0 0 0 0 1      Review of Systems  Constitutional: Negative for fever and chills.  Eyes: Positive for discharge, redness and itching. Negative for visual disturbance.  All other systems reviewed and are  negative.    Allergies  Shellfish allergy and Doxycycline  Home Medications   Current Outpatient Rx  Name  Route  Sig  Dispense  Refill  . cetirizine (ZYRTEC) 10 MG tablet   Oral   Take 10 mg by mouth daily as needed for allergies.         . Olopatadine HCl 0.2 % SOLN   Both Eyes   Place 1 drop into both eyes daily as needed (seasonal allergies).         . Prenatal Vit-Fe Fumarate-FA (PRENATAL MULTIVITAMIN) TABS   Oral   Take 1 tablet by mouth daily.         Marland Kitchen tobramycin-dexamethasone (TOBRADEX) ophthalmic solution   Right Eye   Place 1 drop into the right eye every 4 (four) hours while awake. Start date 12/06/12           BP 113/59  Temp(Src) 98.3 F (36.8 C) (Oral)  Resp 18  SpO2 98%  LMP 11/11/2012  Physical Exam  Nursing note and vitals reviewed. Constitutional: She appears well-developed and well-nourished.  HENT:  Head: Normocephalic and atraumatic.  Right Ear: Tympanic membrane and ear canal normal.  Left Ear: Tympanic membrane and ear canal normal.  Nose: Nose normal.  Mouth/Throat: Oropharynx is clear and moist.  Eyes: EOM and  lids are normal. Pupils are equal, round, and reactive to light. No foreign bodies found. No foreign body present in the right eye. No foreign body present in the left eye. Right conjunctiva is injected. Left conjunctiva is injected.  Neck: Normal range of motion. Neck supple.  Cardiovascular: Normal rate, regular rhythm and normal heart sounds.   Pulmonary/Chest: Effort normal and breath sounds normal.  Neurological: She is alert.  Skin: Skin is warm and dry.    ED Course  Procedures (including critical care time)  Labs Reviewed - No data to display No results found.   No diagnosis found.    MDM  Patient with symptoms consistent with Bacterial Conjunctivitis.  Patient prescribed with antibiotic drops.  Return precautions given.        Pascal Lux [place], PA-C 12/09/12 [phone]

## 2012-12-09 NOTE — ED Notes (Signed)
Pt started 3 days ago with right eye redness and irritation. No know injury. Now in both eyes. Does not wear glasses or contacts.

## 2013-08-07 IMAGING — US US PELVIS COMPLETE
1 series · 14 of 22 positions shown · non-contrast
Comparison: Pelvic ultrasound performed 05/16/2011

CLINICAL DATA: Heavy postpartum bleeding.

TRANSABDOMINAL ULTRASOUND OF PELVIS
TECHNIQUE: Transabdominal ultrasound examination of the pelvis was
performed including evaluation of the uterus, ovaries, adnexal
regions, and pelvic cul-de-sac.

[Series 1: us pelvis complete · 14 of 22 slices shown]
[im 1/22]
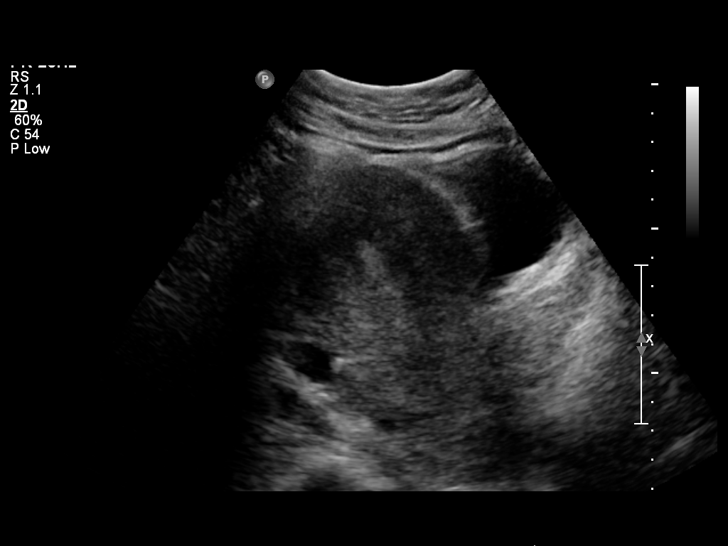
[im 3/22]
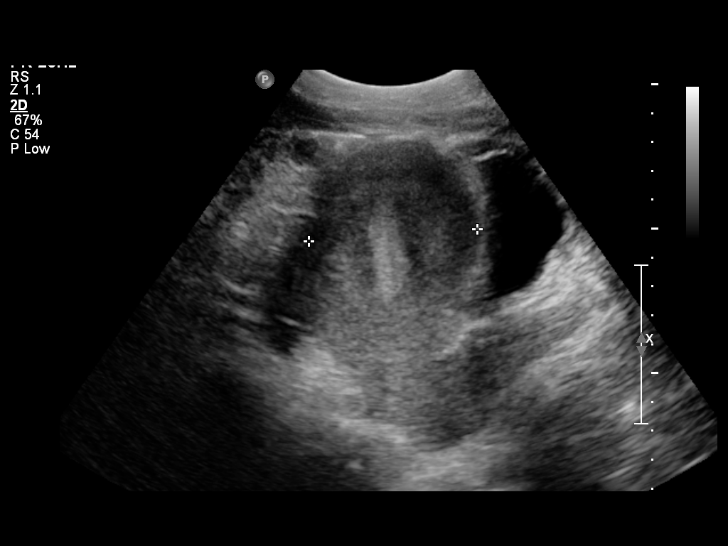
[im 4/22]
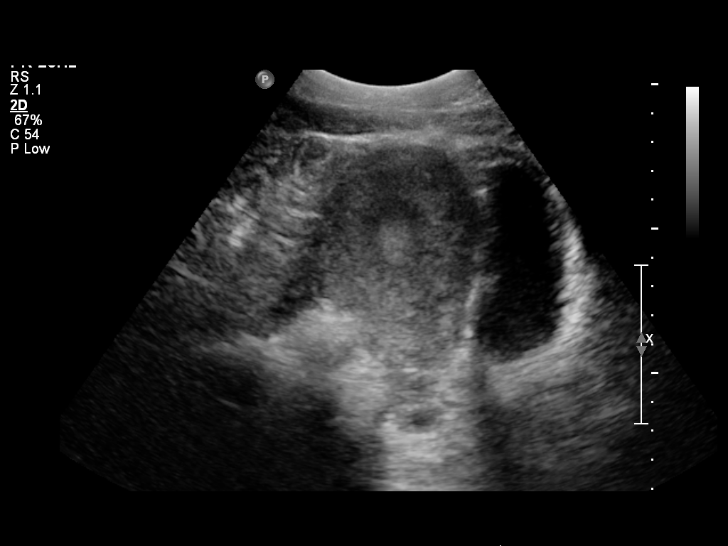
[im 6/22]
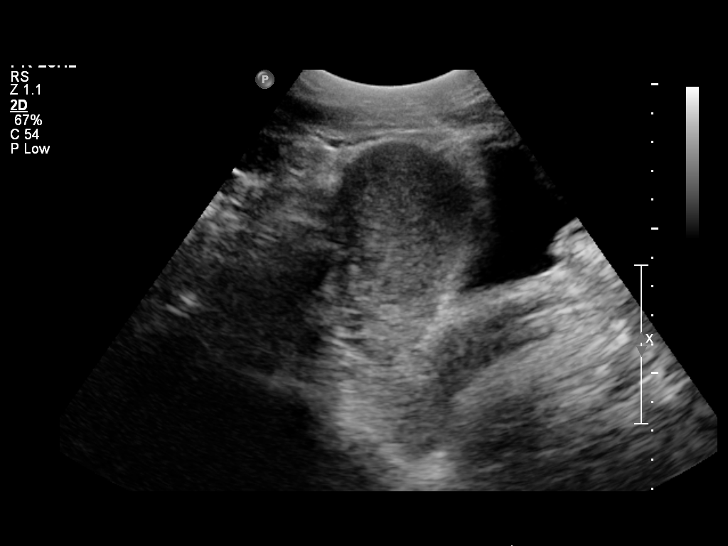
[im 8/22]
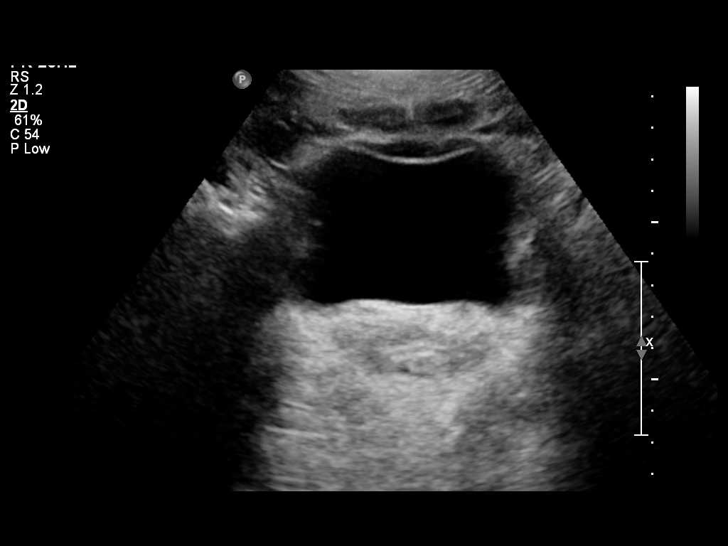
[im 9/22]
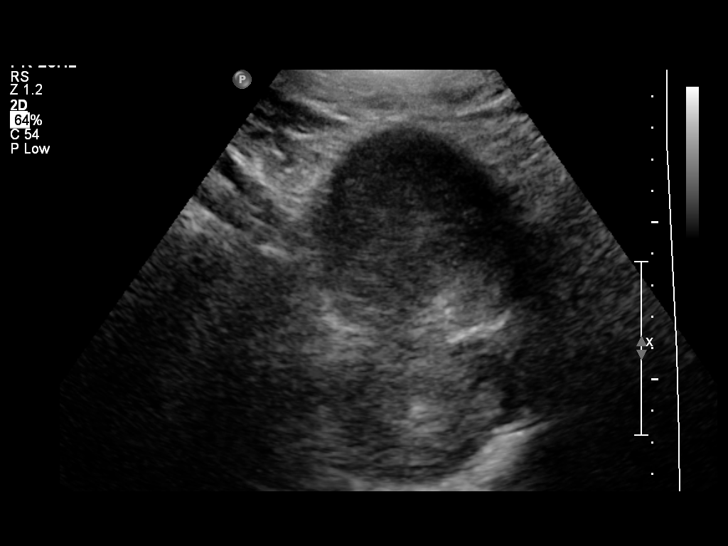
[im 11/22]
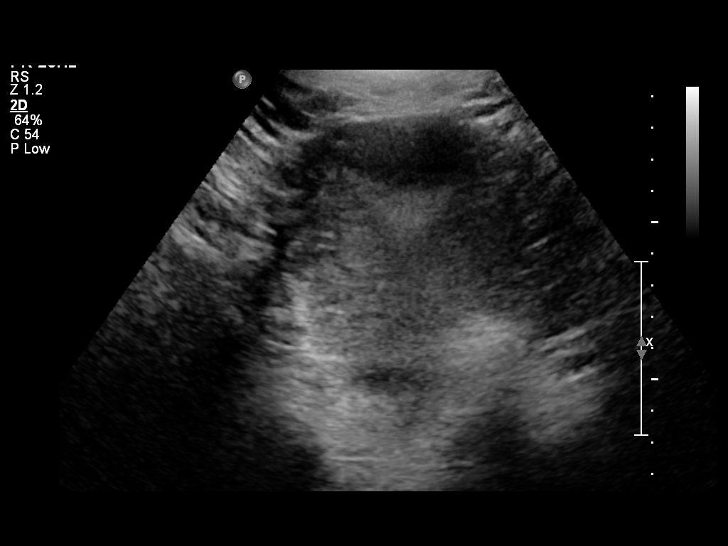
[im 12/22]
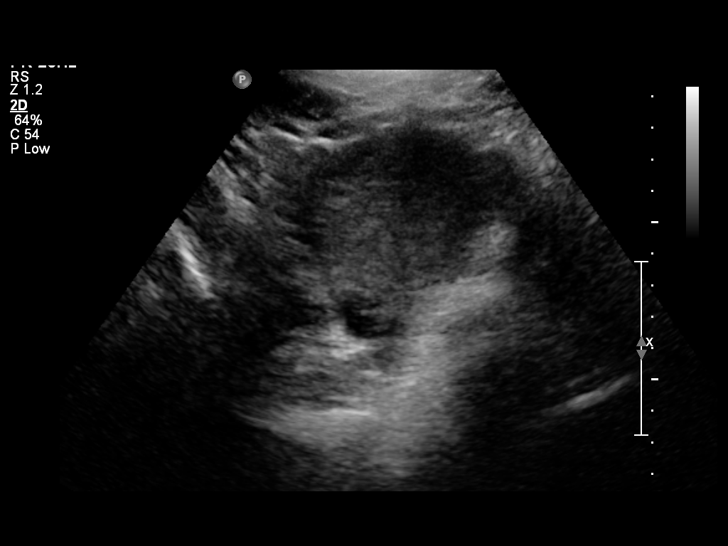
[im 14/22]
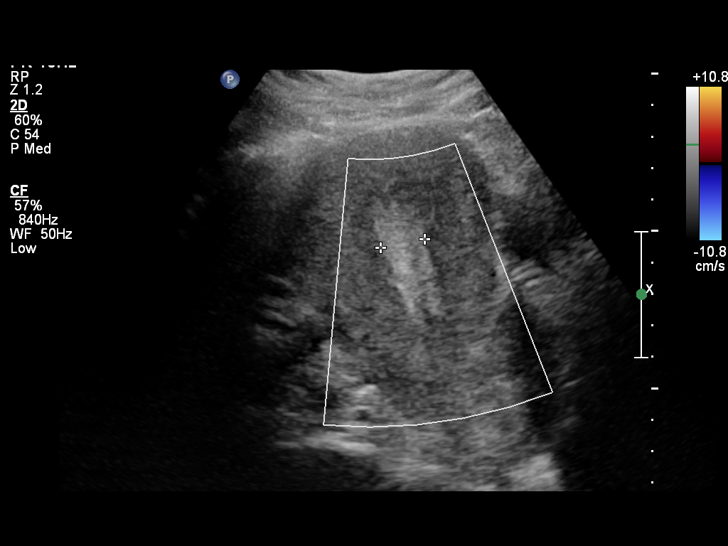
[im 15/22]
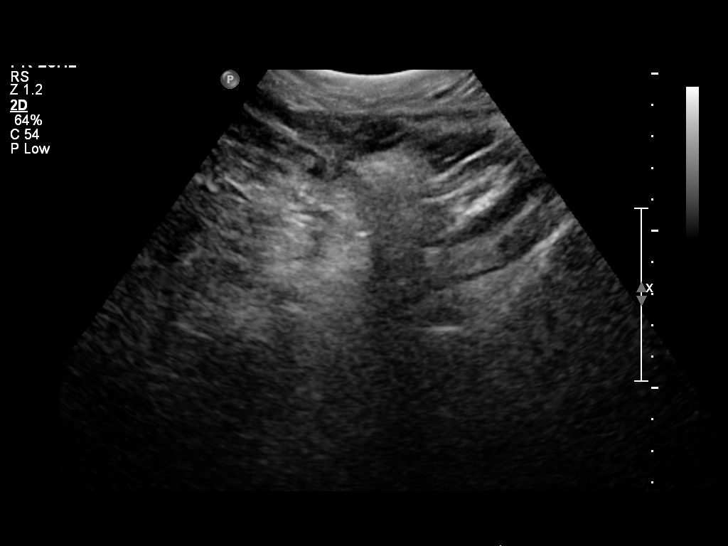
[im 17/22]
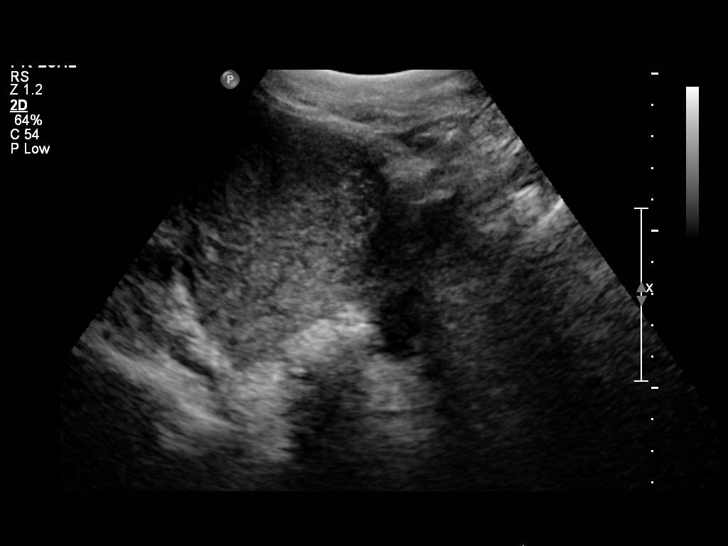
[im 19/22]
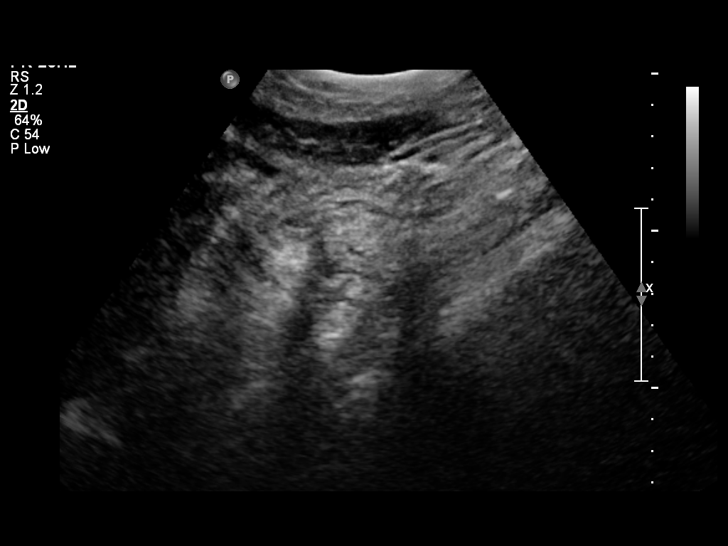
[im 20/22]
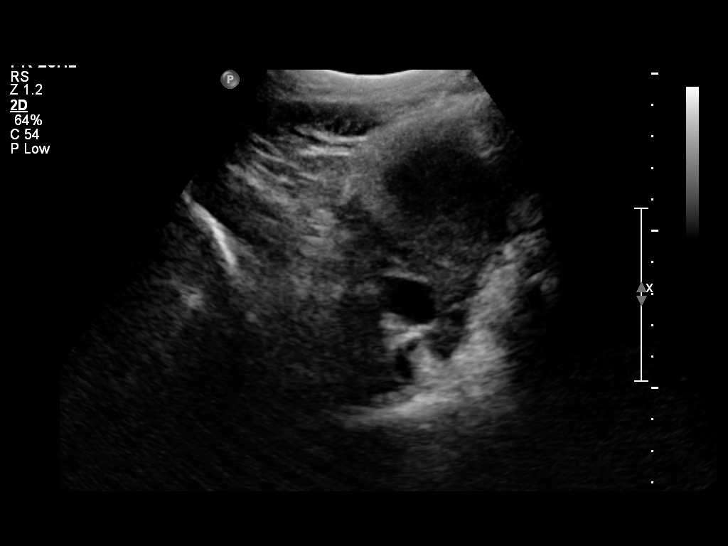
[im 22/22]
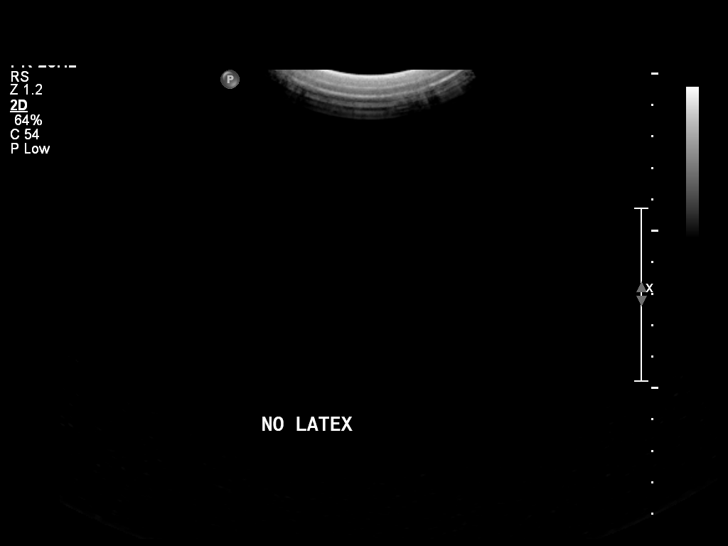

[14 of 22 positions shown; findings below may reference images not displayed]

FINDINGS: Uterus:  Normal in size and appearance, given the postpartum state;
measures 11.4 x 5.8 x 6.6 cm.

Endometrium: Normal in thickness and appearance; measures 1.4 cm in
thickness.  No retained products of conception are characterized.

Right ovary: Not seen

Left ovary: Not seen

Other Findings:  No free fluid is seen within the pelvic cul-de-
sac.
IMPRESSION: Normal postpartum study.  No evidence for retained products of
conception.

## 2013-08-21 ENCOUNTER — Encounter (HOSPITAL_COMMUNITY): Payer: Self-pay | Admitting: Emergency Medicine

## 2013-08-21 ENCOUNTER — Emergency Department (HOSPITAL_COMMUNITY)
Admission: EM | Admit: 2013-08-21 | Discharge: 2013-08-21 | Disposition: A | Discharge status: Home / Self Care | Payer: Medicaid Other | Attending: Emergency Medicine | Admitting: Emergency Medicine

## 2013-08-21 DIAGNOSIS — Z8619 Personal history of other infectious and parasitic diseases: Secondary | ICD-10-CM | POA: Insufficient documentation

## 2013-08-21 DIAGNOSIS — J45901 Unspecified asthma with (acute) exacerbation: Secondary | ICD-10-CM

## 2013-08-21 DIAGNOSIS — Z8669 Personal history of other diseases of the nervous system and sense organs: Secondary | ICD-10-CM | POA: Insufficient documentation

## 2013-08-21 DIAGNOSIS — Z87891 Personal history of nicotine dependence: Secondary | ICD-10-CM | POA: Insufficient documentation

## 2013-08-21 DIAGNOSIS — J029 Acute pharyngitis, unspecified: Secondary | ICD-10-CM | POA: Insufficient documentation

## 2013-08-21 MED ORDER — ALBUTEROL SULFATE HFA 108 (90 BASE) MCG/ACT IN AERS: 1.0000 | INHALATION_SPRAY | RESPIRATORY_TRACT | Status: DC | PRN

## 2013-08-21 MED ORDER — ALBUTEROL SULFATE HFA 108 (90 BASE) MCG/ACT IN AERS
2.0000 | INHALATION_SPRAY | Freq: Once | RESPIRATORY_TRACT | Status: AC
  Administered 2013-08-21: 2 via RESPIRATORY_TRACT
  Filled 2013-08-21: qty 6.7

## 2013-08-21 MED ORDER — DEXAMETHASONE 6 MG PO TABS
10.0000 mg | ORAL_TABLET | Freq: Once | ORAL | Status: AC
  Administered 2013-08-21: 10 mg via ORAL
  Filled 2013-08-21: qty 1

## 2013-08-21 NOTE — ED Notes (Signed)
Bed: WA21 Expected date: 08/21/13 Expected time: 10:51 AM Means of arrival: Ambulance Comments: Asthma, wheezing breathing treatment in process

## 2013-08-21 NOTE — Discharge Instructions (Signed)
Asthma, Adult Asthma is a recurring condition in which the airways tighten and narrow. Asthma can make it difficult to breathe. It can cause coughing, wheezing, and shortness of breath. Asthma episodes (also called asthma attacks) range from minor to life-threatening. Asthma cannot be cured, but medicines and lifestyle changes can help control it. CAUSES Asthma is believed to be caused by inherited (genetic) and environmental factors, but its exact cause is unknown. Asthma may be triggered by allergens, lung infections, or irritants in the air. Asthma triggers are different for each person. Common triggers include:   Animal dander.  Dust mites.  Cockroaches.  Pollen from trees or grass.  Mold.  Smoke.  Air pollutants such as dust, household cleaners, hair sprays, aerosol sprays, paint fumes, strong chemicals, or strong odors.  Cold air, weather changes, and winds (which increase molds and pollens in the air).  Strong emotional expressions such as crying or laughing hard.  Stress.  Certain medicines (such as aspirin) or types of drugs (such as beta-blockers).  Sulfites in foods and drinks. Foods and drinks that may contain sulfites include dried fruit, potato chips, and sparkling grape juice.  Infections or inflammatory conditions such as the flu, a cold, or an inflammation of the nasal membranes (rhinitis).  Gastroesophageal reflux disease (GERD).  Exercise or strenuous activity. SYMPTOMS Symptoms may occur immediately after asthma is triggered or many hours later. Symptoms include:  Wheezing.  Excessive nighttime or early morning coughing.  Frequent or severe coughing with a common cold.  Chest tightness.  Shortness of breath. DIAGNOSIS  The diagnosis of asthma is made by a review of your medical history and a physical exam. Tests may also be performed. These may include:  Lung function studies. These tests show how much air you breath in and out.  Allergy  tests.  Imaging tests such as X-rays. TREATMENT  Asthma cannot be cured, but it can usually be controlled. Treatment involves identifying and avoiding your asthma triggers. It also involves medicines. There are 2 classes of medicine used for asthma treatment:   Controller medicines. These prevent asthma symptoms from occurring. They are usually taken every day.  Reliever or rescue medicines. These quickly relieve asthma symptoms. They are used as needed and provide short-term relief. Your health care provider will help you create an asthma action plan. An asthma action plan is a written plan for managing and treating your asthma attacks. It includes a list of your asthma triggers and how they may be avoided. It also includes information on when medicines should be taken and when their dosage should be changed. An action plan may also involve the use of a device called a peak flow meter. A peak flow meter measures how well the lungs are working. It helps you monitor your condition. HOME CARE INSTRUCTIONS   Take medicine as directed by your health care provider. Speak with your health care provider if you have questions about how or when to take the medicines.  Use a peak flow meter as directed by your health care provider. Record and keep track of readings.  Understand and use the action plan to help minimize or stop an asthma attack without needing to seek medical care.  Control your home environment in the following ways to help prevent asthma attacks:  Do not smoke. Avoid being exposed to secondhand smoke.  Change your heating and air conditioning filter regularly.  Limit your use of fireplaces and wood stoves.  Get rid of pests (such as roaches and   mice) and their droppings.  Throw away plants if you see mold on them.  Clean your floors and dust regularly. Use unscented cleaning products.  Try to have someone else vacuum for you regularly. Stay out of rooms while they are being  vacuumed and for a short while afterward. If you vacuum, use a dust mask from a hardware store, a double-layered or microfilter vacuum cleaner bag, or a vacuum cleaner with a HEPA filter.  Replace carpet with wood, tile, or vinyl flooring. Carpet can trap dander and dust.  Use allergy-proof pillows, mattress covers, and box spring covers.  Wash bed sheets and blankets every week in hot water and dry them in a dryer.  Use blankets that are made of polyester or cotton.  Clean bathrooms and kitchens with bleach. If possible, have someone repaint the walls in these rooms with mold-resistant paint. Keep out of the rooms that are being cleaned and painted.  Wash hands frequently. SEEK MEDICAL CARE IF:   You have wheezing, shortness of breath, or a cough even if taking medicine to prevent attacks.  The colored mucus you cough up (sputum) is thicker than usual.  Your sputum changes from clear or white to yellow, green, gray, or bloody.  You have any problems that may be related to the medicines you are taking (such as a rash, itching, swelling, or trouble breathing).  You are using a reliever medicine more than 2 3 times per week.  Your peak flow is still at 50 79% of you personal best after following your action plan for 1 hour. SEEK IMMEDIATE MEDICAL CARE IF:   You seem to be getting worse and are unresponsive to treatment during an asthma attack.  You are short of breath even at rest.  You get short of breath when doing very little physical activity.  You have difficulty eating, drinking, or talking due to asthma symptoms.  You develop chest pain.  You develop a fast heartbeat.  You have a bluish color to your lips or fingernails.  You are lightheaded, dizzy, or faint.  Your peak flow is less than 50% of your personal best.  You have a fever or persistent symptoms for more than 2 3 days.  You have a fever and symptoms suddenly get worse. MAKE SURE YOU:   Understand these  instructions.  Will watch your condition.  Will get help right away if you are not doing well or get worse. Document Released: 07/14/2005 Document Revised: 03/16/2013 Document Reviewed: 02/10/2013 [employer] Patient Information 2014 [employer].  

## 2013-08-21 NOTE — ED Notes (Signed)
Pt from home via [facility] c/o shortness of breath. Pt Hx of Asthma. She has had symptoms (cough, congestion) for a few days and shortness of breath worsen today. 5mg  of Albuterol enroute with relief. She is out of her rescue inhaler.

## 2013-08-21 NOTE — ED Provider Notes (Signed)
CSN: [id number]     Arrival date & time 08/21/13  1053 History   First MD Initiated Contact with Patient 08/21/13 1054     Chief Complaint  Patient presents with  . Shortness of Breath  . Asthma   (Consider location/radiation/quality/duration/timing/severity/associated sxs/prior Treatment) Patient is a 23 y.o. female presenting with shortness of breath and asthma.  Shortness of Breath Severity:  Moderate Onset quality:  Gradual Duration:  2 days Timing:  Constant Progression:  Partially resolved Chronicity:  Recurrent Context comment:  Hx of asthma, ran out of albuterol Relieved by: neb treatment given by EMS. Exacerbated by: mild URI symptoms (congestion, sore throat) Associated symptoms: no abdominal pain, no chest pain, no cough, no fever, no sputum production and no vomiting   Asthma Associated symptoms include shortness of breath. Pertinent negatives include no chest pain and no abdominal pain.    Past Medical History  Diagnosis Date  . Asthma   . Trichomonas   . Chlamydia   . Pseudotumor cerebri   . EXBMWUXL(244.0Headache(784.0)    Past Surgical History  Procedure Laterality Date  . Lumbar puncture     Family History  Problem Relation Age of Onset  . Anesthesia problems Neg Hx    History  Substance Use Topics  . Smoking status: Former Games developermoker  . Smokeless tobacco: Not on file  . Alcohol Use: No     Comment: Marijuana stopped a "few" months ago   OB History   Grav Para Term Preterm Abortions TAB SAB Ect Mult Living   1 1 1  0 0 0 0 0 0 1     Review of Systems  Constitutional: Negative for fever.  HENT: Negative for congestion.   Respiratory: Positive for shortness of breath. Negative for cough and sputum production.   Cardiovascular: Negative for chest pain.  Gastrointestinal: Negative for nausea, vomiting, abdominal pain and diarrhea.  All other systems reviewed and are negative.    Allergies  Shellfish allergy and Doxycycline  Home Medications   Current  Outpatient Rx  Name  Route  Sig  Dispense  Refill  . Prenatal Vit-Fe Fumarate-FA (PRENATAL MULTIVITAMIN) TABS   Oral   Take 1 tablet by mouth daily.         . Pseudoephedrine-Ibuprofen (ADVIL COLD/SINUS PO)   Oral   Take 2 tablets by mouth every 8 (eight) hours as needed (cold/flu symptoms).          BP 120/75  Pulse 91  Resp 22  SpO2 98% Physical Exam  Nursing note and vitals reviewed. Constitutional: She is oriented to person, place, and time. She appears well-developed and well-nourished. No distress.  HENT:  Head: Normocephalic and atraumatic.  Mouth/Throat: Oropharynx is clear and moist.  Eyes: Conjunctivae are normal. Pupils are equal, round, and reactive to light. No scleral icterus.  Neck: Neck supple.  Cardiovascular: Normal rate, regular rhythm, normal heart sounds and intact distal pulses.   No murmur heard. Pulmonary/Chest: Effort normal and breath sounds normal. No stridor. No respiratory distress. She has no rales.  Good air movement  Abdominal: Soft. Bowel sounds are normal. She exhibits no distension. There is no tenderness.  Musculoskeletal: Normal range of motion.  Neurological: She is alert and oriented to person, place, and time.  Skin: Skin is warm and dry. No rash noted.  Psychiatric: She has a normal mood and affect. Her behavior is normal.    ED Course  Procedures (including critical care time) Labs Review Labs Reviewed - No data to display  Imaging Review No results found.  EKG Interpretation   None       MDM   1. Asthma exacerbation, mild    23 yo female with wheezing and SOB which near resolved with EMS administered nebulizer treatment.  Nondistressed with good air movement here.  O2 sats have been good even before neb.  Pt out of albuterol at home, so will give prescription.  Advised to follow up with her PCP.    12:53 PM On reassess, pt looks well, breathing regularly, good air movement, no wheezing, no rales.  Stable for discharge.     Candyce Churn, MD 08/21/13 1255

## 2013-09-07 ENCOUNTER — Other Ambulatory Visit (HOSPITAL_COMMUNITY)
Admission: RE | Admit: 2013-09-07 | Discharge: 2013-09-07 | Disposition: A | Discharge status: Home / Self Care | Payer: Medicaid Other | Source: Ambulatory Visit | Attending: Family Medicine | Admitting: Family Medicine

## 2013-09-07 ENCOUNTER — Other Ambulatory Visit: Payer: Medicaid Other | Admitting: Family Medicine

## 2013-09-07 DIAGNOSIS — Z113 Encounter for screening for infections with a predominantly sexual mode of transmission: Secondary | ICD-10-CM | POA: Insufficient documentation

## 2013-09-07 DIAGNOSIS — N760 Acute vaginitis: Secondary | ICD-10-CM | POA: Insufficient documentation

## 2013-09-12 LAB — URINE CYTOLOGY ANCILLARY ONLY
Bacterial vaginitis: POSITIVE — AB
Bacterial vaginitis: POSITIVE — AB
CANDIDA VAGINITIS: POSITIVE — AB
CHLAMYDIA, DNA PROBE: NEGATIVE
NEISSERIA GONORRHEA: NEGATIVE
TRICH (WINDOWPATH): NEGATIVE

## 2013-12-06 ENCOUNTER — Emergency Department (HOSPITAL_COMMUNITY)
Admission: EM | Admit: 2013-12-06 | Discharge: 2013-12-06 | Disposition: A | Discharge status: Home / Self Care | Payer: Medicaid Other | Attending: Emergency Medicine | Admitting: Emergency Medicine

## 2013-12-06 ENCOUNTER — Encounter (HOSPITAL_COMMUNITY): Payer: Self-pay | Admitting: Emergency Medicine

## 2013-12-06 DIAGNOSIS — Z87891 Personal history of nicotine dependence: Secondary | ICD-10-CM | POA: Insufficient documentation

## 2013-12-06 DIAGNOSIS — Z79899 Other long term (current) drug therapy: Secondary | ICD-10-CM | POA: Insufficient documentation

## 2013-12-06 DIAGNOSIS — Y92410 Unspecified street and highway as the place of occurrence of the external cause: Secondary | ICD-10-CM | POA: Insufficient documentation

## 2013-12-06 DIAGNOSIS — Z8619 Personal history of other infectious and parasitic diseases: Secondary | ICD-10-CM | POA: Insufficient documentation

## 2013-12-06 DIAGNOSIS — IMO0002 Reserved for concepts with insufficient information to code with codable children: Secondary | ICD-10-CM | POA: Insufficient documentation

## 2013-12-06 DIAGNOSIS — Y9389 Activity, other specified: Secondary | ICD-10-CM | POA: Insufficient documentation

## 2013-12-06 DIAGNOSIS — M791 Myalgia, unspecified site: Secondary | ICD-10-CM

## 2013-12-06 DIAGNOSIS — Z8669 Personal history of other diseases of the nervous system and sense organs: Secondary | ICD-10-CM | POA: Insufficient documentation

## 2013-12-06 DIAGNOSIS — J45909 Unspecified asthma, uncomplicated: Secondary | ICD-10-CM | POA: Insufficient documentation

## 2013-12-06 MED ORDER — IBUPROFEN 800 MG PO TABS: 800.0000 mg | ORAL_TABLET | Freq: Three times a day (TID) | ORAL | Status: DC

## 2013-12-06 MED ORDER — CYCLOBENZAPRINE HCL 10 MG PO TABS: 10.0000 mg | ORAL_TABLET | Freq: Two times a day (BID) | ORAL | Status: DC | PRN
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
## 2013-12-06 NOTE — ED Notes (Signed)
PA student at bedside.
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
## 2013-12-06 NOTE — Discharge Instructions (Signed)
Cryotherapy Cryotherapy means treatment with cold. Ice or gel packs can be used to reduce both pain and swelling. Ice is the most helpful within the first 24 to 48 hours after an injury or flareup from overusing a muscle or joint. Sprains, strains, spasms, burning pain, shooting pain, and aches can all be eased with ice. Ice can also be used when recovering from surgery. Ice is effective, has very few side effects, and is safe for most people to use. PRECAUTIONS  Ice is not a safe treatment option for people with:  Raynaud's phenomenon. This is a condition affecting small blood vessels in the extremities. Exposure to cold may cause your problems to return.  Cold hypersensitivity. There are many forms of cold hypersensitivity, including:  Cold urticaria. Red, itchy hives appear on the skin when the tissues begin to warm after being iced.  Cold erythema. This is a red, itchy rash caused by exposure to cold.  Cold hemoglobinuria. Red blood cells break down when the tissues begin to warm after being iced. The hemoglobin that carry oxygen are passed into the urine because they cannot combine with blood proteins fast enough.  Numbness or altered sensitivity in the area being iced. If you have any of the following conditions, do not use ice until you have discussed cryotherapy with your caregiver:  Heart conditions, such as arrhythmia, angina, or chronic heart disease.  High blood pressure.  Healing wounds or open skin in the area being iced.  Current infections.  Rheumatoid arthritis.  Poor circulation.  Diabetes. Ice slows the blood flow in the region it is applied. This is beneficial when trying to stop inflamed tissues from spreading irritating chemicals to surrounding tissues. However, if you expose your skin to cold temperatures for too long or without the proper protection, you can damage your skin or nerves. Watch for signs of skin damage due to cold. HOME CARE INSTRUCTIONS Follow  these tips to use ice and cold packs safely.  Place a dry or damp towel between the ice and skin. A damp towel will cool the skin more quickly, so you may need to shorten the time that the ice is used.  For a more rapid response, add gentle compression to the ice.  Ice for no more than 10 to 20 minutes at a time. The bonier the area you are icing, the less time it will take to get the benefits of ice.  Check your skin after 5 minutes to make sure there are no signs of a poor response to cold or skin damage.  Rest 20 minutes or more in between uses.  Once your skin is numb, you can end your treatment. You can test numbness by very lightly touching your skin. The touch should be so light that you do not see the skin dimple from the pressure of your fingertip. When using ice, most people will feel these normal sensations in this order: cold, burning, aching, and numbness.  Do not use ice on someone who cannot communicate their responses to pain, such as small children or people with dementia. HOW TO MAKE AN ICE PACK Ice packs are the most common way to use ice therapy. Other methods include ice massage, ice baths, and cryo-sprays. Muscle creams that cause a cold, tingly feeling do not offer the same benefits that ice offers and should not be used as a substitute unless recommended by your caregiver. To make an ice pack, do one of the following:  Place crushed ice or  a bag of frozen vegetables in a sealable plastic bag. Squeeze out the excess air. Place this bag inside another plastic bag. Slide the bag into a pillowcase or place a damp towel between your skin and the bag. °· Mix 3 parts water with 1 part rubbing alcohol. Freeze the mixture in a sealable plastic bag. When you remove the mixture from the freezer, it will be slushy. Squeeze out the excess air. Place this bag inside another plastic bag. Slide the bag into a pillowcase or place a damp towel between your skin and the bag. °SEEK MEDICAL  CARE IF: °· You develop white spots on your skin. This may give the skin a blotchy (mottled) appearance. °· Your skin turns blue or pale. °· Your skin becomes waxy or hard. °· Your swelling gets worse. °MAKE SURE YOU:  °· Understand these instructions. °· Will watch your condition. °· Will get help right away if you are not doing well or get worse. °Document Released: 03/10/2011 Document Revised: 10/06/2011 Document Reviewed: 03/10/2011 °[employer]® Patient Information ©2014 [employer]. °Muscle Strain °A muscle strain is an injury that occurs when a muscle is stretched beyond its normal length. Usually a small number of muscle fibers are torn when this happens. Muscle strain is rated in degrees. First-degree strains have the least amount of muscle fiber tearing and pain. Second-degree and third-degree strains have increasingly more tearing and pain.  °Usually, recovery from muscle strain takes 1 2 weeks. Complete healing takes 5 6 weeks.  °CAUSES  °Muscle strain happens when a sudden, violent force placed on a muscle stretches it too far. This may occur with lifting, sports, or a fall.  °RISK FACTORS °Muscle strain is especially common in athletes.  °SIGNS AND SYMPTOMS °At the site of the muscle strain, there may be: °· Pain. °· Bruising. °· Swelling. °· Difficulty using the muscle due to pain or lack of normal function. °DIAGNOSIS  °Your health care provider will perform a physical exam and ask about your medical history. °TREATMENT  °Often, the best treatment for a muscle strain is resting, icing, and applying cold compresses to the injured area.   °HOME CARE INSTRUCTIONS  °· Use the PRICE method of treatment to promote muscle healing during the first 2 3 days after your injury. The PRICE method involves: °· Protecting the muscle from being injured again. °· Restricting your activity and resting the injured body part. °· Icing your injury. To do this, put ice in a plastic bag. Place a towel between your skin and  the bag. Then, apply the ice and leave it on from 15 20 minutes each hour. After the third day, switch to moist heat packs. °· Apply compression to the injured area with a splint or elastic bandage. Be careful not to wrap it too tightly. This may interfere with blood circulation or increase swelling. °· Elevate the injured body part above the level of your heart as often as you can. °· Only take over-the-counter or prescription medicines for pain, discomfort, or fever as directed by your health care provider. °· Warming up prior to exercise helps to prevent future muscle strains. °SEEK MEDICAL CARE IF:  °· You have increasing pain or swelling in the injured area. °· You have numbness, tingling, or a significant loss of strength in the injured area. °MAKE SURE YOU:  °· Understand these instructions. °· Will watch your condition. °· Will get help right away if you are not doing well or get worse. °Document Released: 07/14/2005 Document   Revised: 05/04/2013 Document Reviewed: 02/10/2013 °[employer]® Patient Information ©2014 [employer]. °Motor Vehicle Collision  °It is common to have multiple bruises and sore muscles after a motor vehicle collision (MVC). These tend to feel worse for the first 24 hours. You may have the most stiffness and soreness over the first several hours. You may also feel worse when you wake up the first morning after your collision. After this point, you will usually begin to improve with each day. The speed of improvement often depends on the severity of the collision, the number of injuries, and the location and nature of these injuries. °HOME CARE INSTRUCTIONS  °· Put ice on the injured area. °· Put ice in a plastic bag. °· Place a towel between your skin and the bag. °· Leave the ice on for 15-20 minutes, 03-04 times a day. °· Drink enough fluids to keep your urine clear or pale yellow. Do not drink alcohol. °· Take a warm shower or bath once or twice a day. This will increase blood flow to  sore muscles. °· You may return to activities as directed by your caregiver. Be careful when lifting, as this may aggravate neck or back pain. °· Only take over-the-counter or prescription medicines for pain, discomfort, or fever as directed by your caregiver. Do not use aspirin. This may increase bruising and bleeding. °SEEK IMMEDIATE MEDICAL CARE IF: °· You have numbness, tingling, or weakness in the arms or legs. °· You develop severe headaches not relieved with medicine. °· You have severe neck pain, especially tenderness in the middle of the back of your neck. °· You have changes in bowel or bladder control. °· There is increasing pain in any area of the body. °· You have shortness of breath, lightheadedness, dizziness, or fainting. °· You have chest pain. °· You feel sick to your stomach (nauseous), throw up (vomit), or sweat. °· You have increasing abdominal discomfort. °· There is blood in your urine, stool, or vomit. °· You have pain in your shoulder (shoulder strap areas). °· You feel your symptoms are getting worse. °MAKE SURE YOU:  °· Understand these instructions. °· Will watch your condition. °· Will get help right away if you are not doing well or get worse. °Document Released: 07/14/2005 Document Revised: 10/06/2011 Document Reviewed: 12/11/2010 °[employer]® Patient Information ©2014 [employer]. ° °

## 2013-12-09 NOTE — ED Provider Notes (Signed)
Medical screening examination/treatment/procedure(s) were performed by non-physician practitioner and as supervising physician I was immediately available for consultation/collaboration.   EKG Interpretation None        Gwyneth SproutWhitney Shirel Mallis, MD 12/09/13 1546

## 2014-01-21 ENCOUNTER — Encounter (HOSPITAL_COMMUNITY): Payer: Self-pay | Admitting: Emergency Medicine

## 2014-01-21 ENCOUNTER — Emergency Department (HOSPITAL_COMMUNITY)
Admission: EM | Admit: 2014-01-21 | Discharge: 2014-01-22 | Disposition: A | Discharge status: Home / Self Care | Payer: Medicaid Other | Attending: Emergency Medicine | Admitting: Emergency Medicine

## 2014-01-21 DIAGNOSIS — Z8619 Personal history of other infectious and parasitic diseases: Secondary | ICD-10-CM | POA: Insufficient documentation

## 2014-01-21 DIAGNOSIS — J45909 Unspecified asthma, uncomplicated: Secondary | ICD-10-CM | POA: Insufficient documentation

## 2014-01-21 DIAGNOSIS — Z3202 Encounter for pregnancy test, result negative: Secondary | ICD-10-CM | POA: Insufficient documentation

## 2014-01-21 DIAGNOSIS — Z792 Long term (current) use of antibiotics: Secondary | ICD-10-CM | POA: Insufficient documentation

## 2014-01-21 DIAGNOSIS — Z87891 Personal history of nicotine dependence: Secondary | ICD-10-CM | POA: Insufficient documentation

## 2014-01-21 DIAGNOSIS — L03312 Cellulitis of back [any part except buttock]: Secondary | ICD-10-CM

## 2014-01-21 DIAGNOSIS — L03319 Cellulitis of trunk, unspecified: Principal | ICD-10-CM

## 2014-01-21 DIAGNOSIS — L02219 Cutaneous abscess of trunk, unspecified: Secondary | ICD-10-CM | POA: Insufficient documentation

## 2014-01-21 DIAGNOSIS — Z8669 Personal history of other diseases of the nervous system and sense organs: Secondary | ICD-10-CM | POA: Insufficient documentation

## 2014-01-21 DIAGNOSIS — Z79899 Other long term (current) drug therapy: Secondary | ICD-10-CM | POA: Insufficient documentation

## 2014-01-21 LAB — URINALYSIS, ROUTINE W REFLEX MICROSCOPIC
Bilirubin Urine: NEGATIVE
Glucose, UA: NEGATIVE mg/dL
Hgb urine dipstick: NEGATIVE
KETONES UR: NEGATIVE mg/dL
NITRITE: NEGATIVE
PROTEIN: NEGATIVE mg/dL
Specific Gravity, Urine: 1.019 (ref 1.005–1.030)
Urobilinogen, UA: 0.2 mg/dL (ref 0.0–1.0)
pH: 8 (ref 5.0–8.0)

## 2014-01-21 LAB — URINE MICROSCOPIC-ADD ON

## 2014-01-21 LAB — PREGNANCY, URINE: Preg Test, Ur: NEGATIVE

## 2014-01-21 MED ORDER — ACETAMINOPHEN 325 MG PO TABS
650.0000 mg | ORAL_TABLET | Freq: Once | ORAL | Status: AC
  Administered 2014-01-21: 650 mg via ORAL

## 2014-01-21 NOTE — ED Notes (Signed)
The pt is c/o an insect bite to her lower back 3-4 days ago.  Increasing pain there and she has a temp

## 2014-01-22 MED ORDER — TRAMADOL HCL 50 MG PO TABS: 50.0000 mg | ORAL_TABLET | Freq: Four times a day (QID) | ORAL | Status: DC | PRN

## 2014-01-22 MED ORDER — SULFAMETHOXAZOLE-TRIMETHOPRIM 800-160 MG PO TABS: 1.0000 | ORAL_TABLET | Freq: Two times a day (BID) | ORAL | Status: DC

## 2014-01-22 MED ORDER — SULFAMETHOXAZOLE-TMP DS 800-160 MG PO TABS
1.0000 | ORAL_TABLET | Freq: Once | ORAL | Status: AC
  Administered 2014-01-22: 1 via ORAL
  Filled 2014-01-22: qty 1

## 2014-01-22 MED ORDER — OXYCODONE-ACETAMINOPHEN 5-325 MG PO TABS
1.0000 | ORAL_TABLET | Freq: Once | ORAL | Status: AC
Start: 2014-01-22 — End: 2014-01-22
  Administered 2014-01-22: 1 via ORAL
  Filled 2014-01-22: qty 1

## 2014-01-22 NOTE — ED Provider Notes (Signed)
CSN: [id number]     Arrival date & time 01/21/14  2052 History   First MD Initiated Contact with Patient 01/22/14 0028     Chief Complaint  Patient presents with  . Insect Bite     (Consider location/radiation/quality/duration/timing/severity/associated sxs/prior Treatment) HPI Patient is a 23 yo woman with history of pseudotumor cerebri. She presents with 2d of pain in the right low back when she thinks she sustained a bug bite. Although, she can't recall an actual event of bug sting. She says pain is moderately severe, nonradiating, worse with pressure to the area. No midline ttp. No neurologic sx. She is noted to have an elevated temp in the ED tonight. She had not appreciated fever at home.    Past Medical History  Diagnosis Date  . Asthma   . Trichomonas   . Chlamydia   . Pseudotumor cerebri   . WUJWJXBJ(478.2Headache(784.0)    Past Surgical History  Procedure Laterality Date  . Lumbar puncture     Family History  Problem Relation Age of Onset  . Anesthesia problems Neg Hx    History  Substance Use Topics  . Smoking status: Former Games developermoker  . Smokeless tobacco: Not on file  . Alcohol Use: No     Comment: Marijuana stopped a "few" months ago   OB History   Grav Para Term Preterm Abortions TAB SAB Ect Mult Living   1 1 1  0 0 0 0 0 0 1     Review of Systems Ten point review of symptoms performed and is negative with the exception of symptoms noted above.     Allergies  Shellfish allergy and Doxycycline  Home Medications   Prior to Admission medications   Medication Sig Start Date End Date Taking? Authorizing Provider  albuterol (PROVENTIL HFA;VENTOLIN HFA) 108 (90 BASE) MCG/ACT inhaler Inhale 1-2 puffs into the lungs every 4 (four) hours as needed for wheezing or shortness of breath. 08/21/13   Candyce ChurnJohn David Wofford III, MD  cyclobenzaprine (FLEXERIL) 10 MG tablet Take 1 tablet (10 mg total) by mouth 2 (two) times daily as needed for muscle spasms. 12/06/13   Shari A Upstill, PA-C   ibuprofen (ADVIL,MOTRIN) 800 MG tablet Take 1 tablet (800 mg total) by mouth 3 (three) times daily. 12/06/13   Arnoldo HookerShari A Upstill, PA-C  Prenatal Vit-Fe Fumarate-FA (PRENATAL MULTIVITAMIN) TABS Take 1 tablet by mouth daily.    Historical Provider, MD  Pseudoephedrine-Ibuprofen (ADVIL COLD/SINUS PO) Take 2 tablets by mouth every 8 (eight) hours as needed (cold/flu symptoms).    Historical Provider, MD  sulfamethoxazole-trimethoprim (BACTRIM DS,SEPTRA DS) 800-160 MG per tablet Take 1 tablet by mouth 2 (two) times daily. 01/22/14 01/29/14  Brandt LoosenJulie Manly, MD  traMADol (ULTRAM) 50 MG tablet Take 1 tablet (50 mg total) by mouth every 6 (six) hours as needed. 01/22/14   Brandt LoosenJulie Manly, MD   BP 103/51  Pulse 92  Temp(Src) 99.3 F (37.4 C) (Oral)  Resp 18  Ht 5\' 7"  (1.702 m)  Wt 243 lb (110.224 kg)  BMI 38.05 kg/m2  SpO2 100%  LMP 01/11/2014 Physical Exam Gen: well developed and well nourished appearing Head: NCAT Eyes: PERL, EOMI Nose: no epistaixis or rhinorrhea Mouth/throat: mucosa is moist and pink Neck: supple, no stridor Lungs: CTA B, no wheezing, rhonchi or rales CV: RRR, no murmur, extremities appear well perfused.  Abd: soft, notender, nondistended Back: no midline ttp, there is a small, indurated, and slightly raised lesion of 9mm in diameter without fluctuance approx 5cm to  the right of midline at about the L1/L2 level with approx 2cm margin of erythemia.  Skin: warm and dry Ext: normal to inspection, no dependent edema Neuro: CN ii-xii grossly intact, no focal deficits Psyche; normal affect,  calm and cooperative.  ED Course  Procedures (including critical care time) Labs Review  Results for orders placed during the hospital encounter of 01/21/14 (from the past 24 hour(s))  URINALYSIS, ROUTINE W REFLEX MICROSCOPIC     Status: Abnormal   Collection Time    01/21/14  9:11 PM      Result Value Ref Range   Color, Urine YELLOW  YELLOW   APPearance CLOUDY (*) CLEAR   Specific Gravity,  Urine 1.019  1.005 - 1.030   pH 8.0  5.0 - 8.0   Glucose, UA NEGATIVE  NEGATIVE mg/dL   Hgb urine dipstick NEGATIVE  NEGATIVE   Bilirubin Urine NEGATIVE  NEGATIVE   Ketones, ur NEGATIVE  NEGATIVE mg/dL   Protein, ur NEGATIVE  NEGATIVE mg/dL   Urobilinogen, UA 0.2  0.0 - 1.0 mg/dL   Nitrite NEGATIVE  NEGATIVE   Leukocytes, UA SMALL (*) NEGATIVE  PREGNANCY, URINE     Status: None   Collection Time    01/21/14  9:11 PM      Result Value Ref Range   Preg Test, Ur NEGATIVE  NEGATIVE  URINE MICROSCOPIC-ADD ON     Status: Abnormal   Collection Time    01/21/14  9:11 PM      Result Value Ref Range   Squamous Epithelial / LPF MANY (*) RARE   WBC, UA 3-6  <3 WBC/hpf   RBC / HPF 0-2  <3 RBC/hpf   Bacteria, UA FEW (*) RARE     MDM   Final diagnoses:  Cellulitis of back   No sign of abscess. We will tx empirically with Bactrim - 1st dose in ED. Patient is counseled re: return precautions and asked to f/u tomorrow with her PCP for re-examination.     Brandt LoosenJulie Manly, MD 01/22/14 [phone]

## 2014-01-22 NOTE — Discharge Instructions (Signed)
Cellulitis °Cellulitis is an infection of the skin and the tissue under the skin. The infected area is usually red and tender. This happens most often in the arms and lower legs. °HOME CARE  °· Take your antibiotic medicine as told. Finish the medicine even if you start to feel better. °· Keep the infected arm or leg raised (elevated). °· Put a warm cloth on the area up to 4 times per day. °· Only take medicines as told by your doctor. °· Keep all doctor visits as told. °GET HELP RIGHT AWAY IF:  °· You have a fever. °· You feel very sleepy. °· You throw up (vomit) or have watery poop (diarrhea). °· You feel sick and have muscle aches and pains. °· You see red streaks on the skin coming from the infected area. °· Your red area gets bigger or turns a dark color. °· Your bone or joint under the infected area is painful after the skin heals. °· Your infection comes back in the same area or different area. °· You have a puffy (swollen) bump in the infected area. °· You have new symptoms. °MAKE SURE YOU:  °· Understand these instructions. °· Will watch your condition. °· Will get help right away if you are not doing well or get worse. °Document Released: 12/31/2007 Document Revised: 01/13/2012 Document Reviewed: 09/29/2011 °ExitCare® Patient Information ©2015 [employer]. This information is not intended to replace advice given to you by your health care provider. Make sure you discuss any questions you have with your health care provider. ° °

## 2014-01-29 ENCOUNTER — Encounter (HOSPITAL_COMMUNITY): Payer: Self-pay | Admitting: Emergency Medicine

## 2014-01-29 ENCOUNTER — Emergency Department (HOSPITAL_COMMUNITY)
Admission: EM | Admit: 2014-01-29 | Discharge: 2014-01-29 | Disposition: A | Discharge status: Home / Self Care | Payer: Medicaid Other | Attending: Emergency Medicine | Admitting: Emergency Medicine

## 2014-01-29 DIAGNOSIS — Z8669 Personal history of other diseases of the nervous system and sense organs: Secondary | ICD-10-CM | POA: Diagnosis not present

## 2014-01-29 DIAGNOSIS — J45909 Unspecified asthma, uncomplicated: Secondary | ICD-10-CM | POA: Diagnosis not present

## 2014-01-29 DIAGNOSIS — Z79899 Other long term (current) drug therapy: Secondary | ICD-10-CM | POA: Diagnosis not present

## 2014-01-29 DIAGNOSIS — Z792 Long term (current) use of antibiotics: Secondary | ICD-10-CM | POA: Insufficient documentation

## 2014-01-29 DIAGNOSIS — Z791 Long term (current) use of non-steroidal anti-inflammatories (NSAID): Secondary | ICD-10-CM | POA: Insufficient documentation

## 2014-01-29 DIAGNOSIS — L02219 Cutaneous abscess of trunk, unspecified: Secondary | ICD-10-CM | POA: Insufficient documentation

## 2014-01-29 DIAGNOSIS — Z87891 Personal history of nicotine dependence: Secondary | ICD-10-CM | POA: Insufficient documentation

## 2014-01-29 DIAGNOSIS — Z8619 Personal history of other infectious and parasitic diseases: Secondary | ICD-10-CM | POA: Insufficient documentation

## 2014-01-29 DIAGNOSIS — L02212 Cutaneous abscess of back [any part, except buttock]: Secondary | ICD-10-CM

## 2014-01-29 DIAGNOSIS — L03319 Cellulitis of trunk, unspecified: Principal | ICD-10-CM

## 2014-01-29 MED ORDER — ACETAMINOPHEN 325 MG PO TABS
650.0000 mg | ORAL_TABLET | Freq: Once | ORAL | Status: AC
  Administered 2014-01-29: 650 mg via ORAL
  Filled 2014-01-29: qty 2

## 2014-01-29 NOTE — ED Notes (Signed)
Bed: [id number] Expected date:  Expected time:  Means of arrival:  Comments: EMS/cellulitis

## 2014-01-29 NOTE — ED Notes (Signed)
PA at bedside.

## 2014-01-29 NOTE — Discharge Instructions (Signed)
Continue taking Bactrim as prescribed at her last visit. Apply warm compresses to the area that was drained today.  Abscess An abscess is an infected area that contains a collection of pus and debris.It can occur in almost any part of the body. An abscess is also known as a furuncle or boil. CAUSES  An abscess occurs when tissue gets infected. This can occur from blockage of oil or sweat glands, infection of hair follicles, or a minor injury to the skin. As the body tries to fight the infection, pus collects in the area and creates pressure under the skin. This pressure causes pain. People with weakened immune systems have difficulty fighting infections and get certain abscesses more often.  SYMPTOMS Usually an abscess develops on the skin and becomes a painful mass that is red, warm, and tender. If the abscess forms under the skin, you may feel a moveable soft area under the skin. Some abscesses break open (rupture) on their own, but most will continue to get worse without care. The infection can spread deeper into the body and eventually into the bloodstream, causing you to feel ill.  DIAGNOSIS  Your caregiver will take your medical history and perform a physical exam. A sample of fluid may also be taken from the abscess to determine what is causing your infection. TREATMENT  Your caregiver may prescribe antibiotic medicines to fight the infection. However, taking antibiotics alone usually does not cure an abscess. Your caregiver may need to make a small cut (incision) in the abscess to drain the pus. In some cases, gauze is packed into the abscess to reduce pain and to continue draining the area. HOME CARE INSTRUCTIONS   Only take over-the-counter or prescription medicines for pain, discomfort, or fever as directed by your caregiver.  If you were prescribed antibiotics, take them as directed. Finish them even if you start to feel better.  If gauze is used, follow your caregiver's directions for  changing the gauze.  To avoid spreading the infection:  Keep your draining abscess covered with a bandage.  Wash your hands well.  Do not share personal care items, towels, or whirlpools with others.  Avoid skin contact with others.  Keep your skin and clothes clean around the abscess.  Keep all follow-up appointments as directed by your caregiver. SEEK MEDICAL CARE IF:   You have increased pain, swelling, redness, fluid drainage, or bleeding.  You have muscle aches, chills, or a general ill feeling.  You have a fever. MAKE SURE YOU:   Understand these instructions.  Will watch your condition.  Will get help right away if you are not doing well or get worse. Document Released: 04/23/2005 Document Revised: 01/13/2012 Document Reviewed: 09/26/2011 [facility] Patient Information 2015 [place]ExitCare, [place]. This information is not intended to replace advice given to you by your health care provider. Make sure you discuss any questions you have with your health care provider.  Abscess Care After An abscess (also called a boil or furuncle) is an infected area that contains a collection of pus. Signs and symptoms of an abscess include pain, tenderness, redness, or hardness, or you may feel a moveable soft area under your skin. An abscess can occur anywhere in the body. The infection may spread to surrounding tissues causing cellulitis. A cut (incision) by the surgeon was made over your abscess and the pus was drained out. Gauze may have been packed into the space to provide a drain that will allow the cavity to heal from the  inside outwards. The boil may be painful for 5 to 7 days. Most people with a boil do not have high fevers. Your abscess, if seen early, may not have localized, and may not have been lanced. If not, another appointment may be required for this if it does not get better on its own or with medications. HOME CARE INSTRUCTIONS   Only take over-the-counter or prescription  medicines for pain, discomfort, or fever as directed by your caregiver.  When you bathe, soak and then remove gauze or iodoform packs at least daily or as directed by your caregiver. You may then wash the wound gently with mild soapy water. Repack with gauze or do as your caregiver directs. SEEK IMMEDIATE MEDICAL CARE IF:   You develop increased pain, swelling, redness, drainage, or bleeding in the wound site.  You develop signs of generalized infection including muscle aches, chills, fever, or a general ill feeling.  An oral temperature above 102 F (38.9 C) develops, not controlled by medication. See your caregiver for a recheck if you develop any of the symptoms described above. If medications (antibiotics) were prescribed, take them as directed. Document Released: 01/30/2005 Document Revised: 10/06/2011 Document Reviewed: 09/27/2007 [facility] Patient Information 2015 [place]ExitCare, [place]LLC. This information is not intended to replace advice given to you by your health care provider. Make sure you discuss any questions you have with your health care provider.

## 2014-01-29 NOTE — ED Notes (Signed)
Pt from home via [facility] c/o of cellulitis to the right lower back. [facility] given [facility]. Symptoms have since gotten worse. Increased  Yellow drainage from site.

## 2014-01-29 NOTE — ED Notes (Signed)
Pt ambulated to restroom. 

## 2014-01-29 NOTE — ED Provider Notes (Signed)
Medical screening examination/treatment/procedure(s) were performed by non-physician practitioner and as supervising physician I was immediately available for consultation/collaboration.   EKG Interpretation None        Candyce ChurnJohn David Javaughn Opdahl III, MD 01/29/14 [phone]

## 2014-01-29 NOTE — ED Notes (Signed)
Pt called a friend to take her home. Socks provided to the patient as well as gauze for dressing change at home.

## 2014-01-29 NOTE — ED Provider Notes (Signed)
CSN: [id number]     Arrival date & time 01/29/14  [id number] History   First MD Initiated Contact with Patient 01/29/14 [phone]     Chief Complaint  Patient presents with  . Cellulitis    On low back area     (Consider location/radiation/quality/duration/timing/severity/associated sxs/prior Treatment) HPI Comments: 23 year old female presents to the emergency department via EMS complaining of worsening cellulitis to her back. Patient was seen on June 27 and diagnosed with cellulitis and prescribed Bactrim. She has been taking the medication as prescribed, however states she is starting to drain pus from her back. She tried having a friend squeeze the pus and only a little bit came out. Denies fevers. No drainage done at last visit.  The history is provided by the patient.    Past Medical History  Diagnosis Date  . Asthma   . Trichomonas   . Chlamydia   . Pseudotumor cerebri   . YNWGNFAO(130.8Headache(784.0)    Past Surgical History  Procedure Laterality Date  . Lumbar puncture     Family History  Problem Relation Age of Onset  . Anesthesia problems Neg Hx    History  Substance Use Topics  . Smoking status: Former Games developermoker  . Smokeless tobacco: Not on file  . Alcohol Use: No     Comment: Marijuana stopped a "few" months ago   OB History   Grav Para Term Preterm Abortions TAB SAB Ect Mult Living   1 1 1  0 0 0 0 0 0 1     Review of Systems  Skin:       + abscess  All other systems reviewed and are negative.     Allergies  Shellfish allergy and Doxycycline  Home Medications   Prior to Admission medications   Medication Sig Start Date End Date Taking? Authorizing Provider  albuterol (PROVENTIL HFA;VENTOLIN HFA) 108 (90 BASE) MCG/ACT inhaler Inhale 1-2 puffs into the lungs every 4 (four) hours as needed for wheezing or shortness of breath. 08/21/13   Candyce ChurnJohn David Wofford III, MD  cyclobenzaprine (FLEXERIL) 10 MG tablet Take 1 tablet (10 mg total) by mouth 2 (two) times daily as needed for  muscle spasms. 12/06/13   Shari A Upstill, PA-C  ibuprofen (ADVIL,MOTRIN) 800 MG tablet Take 1 tablet (800 mg total) by mouth 3 (three) times daily. 12/06/13   Arnoldo HookerShari A Upstill, PA-C  Prenatal Vit-Fe Fumarate-FA (PRENATAL MULTIVITAMIN) TABS Take 1 tablet by mouth daily.    Historical Provider, MD  Pseudoephedrine-Ibuprofen (ADVIL COLD/SINUS PO) Take 2 tablets by mouth every 8 (eight) hours as needed (cold/flu symptoms).    Historical Provider, MD  sulfamethoxazole-trimethoprim (BACTRIM DS,SEPTRA DS) 800-160 MG per tablet Take 1 tablet by mouth 2 (two) times daily. 01/22/14 01/29/14  Brandt LoosenJulie Manly, MD  traMADol (ULTRAM) 50 MG tablet Take 1 tablet (50 mg total) by mouth every 6 (six) hours as needed. 01/22/14   Brandt LoosenJulie Manly, MD   BP 121/73  Pulse 110  Temp(Src) 99.1 F (37.3 C) (Oral)  Resp 18  SpO2 100%  LMP 01/11/2014 Physical Exam  Nursing note and vitals reviewed. Constitutional: She is oriented to person, place, and time. She appears well-developed and well-nourished. No distress.  HENT:  Head: Normocephalic and atraumatic.  Mouth/Throat: Oropharynx is clear and moist.  Eyes: Conjunctivae and EOM are normal.  Neck: Normal range of motion. Neck supple.  Cardiovascular: Normal rate, regular rhythm and normal heart sounds.   Pulmonary/Chest: Effort normal and breath sounds normal. No respiratory distress.  Musculoskeletal: Normal  range of motion. She exhibits no edema.  Neurological: She is alert and oriented to person, place, and time. No sensory deficit.  Skin: Skin is warm and dry.     Psychiatric: She has a normal mood and affect. Her behavior is normal.    ED Course  Procedures (including critical care time) INCISION AND DRAINAGE Performed by: Johnnette GourdAlbert, Kyriaki Moder Consent: Verbal consent obtained. Risks and benefits: risks, benefits and alternatives were discussed Type: abscess  Body area: back  Anesthesia: local infiltration  Incision was made with a scalpel.  Local anesthetic:  lidocaine 2% with epinephrine  Anesthetic total: 5 ml  Complexity: complex Blunt dissection to break up loculations  Drainage: purulent  Drainage amount: large  Packing material: none  Patient tolerance: Patient tolerated the procedure well with no immediate complications.    Labs Review Labs Reviewed - No data to display  Imaging Review No results found.   EKG Interpretation None      MDM   Final diagnoses:  Abscess of back   Patient presenting back after being seen on June 27 and diagnosed with cellulitis. She's been taking her antibiotic as prescribed. No drainage was done at the prior visit. Abscess drained with a large amount of purulent material expressed. I advised her to continue Bactrim, followup with PCP in 2 days for recheck. Stable for discharge. Return precautions given. Patient states understanding of treatment care plan and is agreeable.   Trevor MaceRobyn M Albert, PA-C 01/29/14 1006

## 2014-05-29 ENCOUNTER — Encounter (HOSPITAL_COMMUNITY): Payer: Self-pay | Admitting: Emergency Medicine

## 2014-07-02 ENCOUNTER — Encounter (HOSPITAL_COMMUNITY): Payer: Self-pay | Admitting: Emergency Medicine

## 2014-07-02 ENCOUNTER — Emergency Department (HOSPITAL_COMMUNITY)
Admission: EM | Admit: 2014-07-02 | Discharge: 2014-07-02 | Disposition: A | Discharge status: Home / Self Care | Payer: Medicaid Other | Attending: Emergency Medicine | Admitting: Emergency Medicine

## 2014-07-02 DIAGNOSIS — K0381 Cracked tooth: Secondary | ICD-10-CM | POA: Insufficient documentation

## 2014-07-02 DIAGNOSIS — K0889 Other specified disorders of teeth and supporting structures: Secondary | ICD-10-CM

## 2014-07-02 DIAGNOSIS — Z8669 Personal history of other diseases of the nervous system and sense organs: Secondary | ICD-10-CM | POA: Insufficient documentation

## 2014-07-02 DIAGNOSIS — Z87891 Personal history of nicotine dependence: Secondary | ICD-10-CM | POA: Insufficient documentation

## 2014-07-02 DIAGNOSIS — Z8619 Personal history of other infectious and parasitic diseases: Secondary | ICD-10-CM | POA: Insufficient documentation

## 2014-07-02 DIAGNOSIS — J45909 Unspecified asthma, uncomplicated: Secondary | ICD-10-CM | POA: Diagnosis not present

## 2014-07-02 DIAGNOSIS — Z79899 Other long term (current) drug therapy: Secondary | ICD-10-CM | POA: Insufficient documentation

## 2014-07-02 DIAGNOSIS — K011 Impacted teeth: Secondary | ICD-10-CM | POA: Insufficient documentation

## 2014-07-02 DIAGNOSIS — K088 Other specified disorders of teeth and supporting structures: Secondary | ICD-10-CM | POA: Diagnosis present

## 2014-07-02 MED ORDER — HYDROCODONE-ACETAMINOPHEN 5-325 MG PO TABS: 2.0000 | ORAL_TABLET | ORAL | Status: DC | PRN

## 2014-07-02 MED ORDER — IBUPROFEN 800 MG PO TABS: 800.0000 mg | ORAL_TABLET | Freq: Three times a day (TID) | ORAL | Status: DC | PRN

## 2014-07-02 MED ORDER — CLINDAMYCIN HCL 150 MG PO CAPS: 150.0000 mg | ORAL_CAPSULE | Freq: Four times a day (QID) | ORAL | Status: DC

## 2014-07-02 NOTE — Discharge Instructions (Signed)

## 2014-07-02 NOTE — ED Notes (Signed)
Per EMS: Pt c/o toothache x 1 wk. No other complaints. Pt was supposed to have wisdom teeth taken out in October but couldn't get a ride, so she never had them taken out.

## 2014-07-02 NOTE — ED Provider Notes (Signed)
CSN: [id number]     Arrival date & time 07/02/14  1558 History   This chart was scribed for a non-physician practitioner, Elson AreasLeslie K Bexlee Bergdoll, PA-C working with No att. providers found by [place] Peace, ED Scribe. The patient was seen in [facility]. The patient's care was started at 5:04 PM.    Chief Complaint  Patient presents with  . Dental Pain      Patient is a 23 y.o. female presenting with tooth pain. The history is provided by the patient. No language interpreter was used.  Dental Pain HPI Comments: Ariana Bentley is a 23 y.o. female who presents to the Emergency Department complaining of dental pain onset 1 week specifically to both wisdom teeth on left side (upper and lower) that radiates down her neck. Pt reports that she was suppose to have wisdom teeth taken out in October but was not able to because she couldn't get a ride. Pt is former smoker. Pt is allergic to Doxycycline.    Past Medical History  Diagnosis Date  . Asthma   . Trichomonas   . Chlamydia   . Pseudotumor cerebri   . WUJWJXBJ(478.2Headache(784.0)    Past Surgical History  Procedure Laterality Date  . Lumbar puncture     Family History  Problem Relation Age of Onset  . Anesthesia problems Neg Hx    History  Substance Use Topics  . Smoking status: Former Games developermoker  . Smokeless tobacco: Not on file  . Alcohol Use: 0.6 oz/week    1 Glasses of wine per week     Comment: Marijuana stopped a "few" months ago   OB History    Gravida Para Term Preterm AB TAB SAB Ectopic Multiple Living   1 1 1  0 0 0 0 0 0 1     Review of Systems  HENT: Positive for dental problem.   All other systems reviewed and are negative.     Allergies  Shellfish allergy and Doxycycline  Home Medications   Prior to Admission medications   Medication Sig Start Date End Date Taking? Authorizing Provider  ibuprofen (ADVIL,MOTRIN) 800 MG tablet Take 800 mg by mouth 3 (three) times daily as needed for mild pain or moderate pain.    Yes Historical  Provider, MD  albuterol (PROVENTIL HFA;VENTOLIN HFA) 108 (90 BASE) MCG/ACT inhaler Inhale 1-2 puffs into the lungs every 4 (four) hours as needed for wheezing or shortness of breath. 08/21/13   Warnell Foresterrey Wofford, MD  traMADol (ULTRAM) 50 MG tablet Take 1 tablet (50 mg total) by mouth every 6 (six) hours as needed. Patient not taking: Reported on 07/02/2014 01/22/14   Brandt LoosenJulie Manly, MD   BP 115/75 mmHg  Pulse 69  Temp(Src) 98.1 F (36.7 C) (Oral)  Resp 17  SpO2 99%  LMP 06/30/2014 Physical Exam  Constitutional: She is oriented to person, place, and time. She appears well-developed and well-nourished. No distress.  HENT:  Head: Normocephalic and atraumatic.  Impacted left lower 3rd molar. Broken left upper 3rd molar.  Eyes: Conjunctivae and EOM are normal.  Neck: Neck supple. No tracheal deviation present.  Cardiovascular: Normal rate.   Pulmonary/Chest: Effort normal. No respiratory distress.  Musculoskeletal: Normal range of motion.  Neurological: She is alert and oriented to person, place, and time.  Skin: Skin is warm and dry.  Psychiatric: She has a normal mood and affect. Her behavior is normal.  Nursing note and vitals reviewed.   ED Course  Procedures (including critical care time) Labs Review Labs Reviewed -  No data to display  Imaging Review No results found.   EKG Interpretation None     Medications - No data to display  5:07 PM- Treatment plan was discussed with patient who verbalizes understanding and agrees.   MDM   Final diagnoses:  Toothache    Hydrocodone Ibuprofen clindamycin  I personally performed the services described in this documentation, which was scribed in my presence. The recorded information has been reviewed and is accurate.   Elson AreasLeslie K Nikash Mortensen, PA-C 07/02/14 1713  Lonia SkinnerLeslie K Anton ChicoSofia, PA-C 07/02/14 1713  Richardean Canalavid H Yao, MD 07/02/14 2322

## 2014-07-14 ENCOUNTER — Encounter (HOSPITAL_COMMUNITY): Payer: Self-pay

## 2014-07-14 ENCOUNTER — Emergency Department (HOSPITAL_COMMUNITY)
Admission: EM | Admit: 2014-07-14 | Discharge: 2014-07-14 | Disposition: A | Discharge status: Home / Self Care | Payer: Medicaid Other | Source: Home / Self Care | Attending: Family Medicine | Admitting: Family Medicine

## 2014-07-14 DIAGNOSIS — K0889 Other specified disorders of teeth and supporting structures: Secondary | ICD-10-CM

## 2014-07-14 DIAGNOSIS — K088 Other specified disorders of teeth and supporting structures: Secondary | ICD-10-CM

## 2014-07-14 MED ORDER — HYDROCODONE-ACETAMINOPHEN 5-325 MG PO TABS: 1.0000 | ORAL_TABLET | ORAL | Status: DC | PRN

## 2014-07-14 MED ORDER — DICLOFENAC POTASSIUM 50 MG PO TABS: 50.0000 mg | ORAL_TABLET | Freq: Three times a day (TID) | ORAL | Status: DC

## 2014-07-14 NOTE — ED Notes (Signed)
States she has an appointment 08-01-2014 to have her tooth extracted. Advised she needs to be sure to keep her appointment, and to be sure she has a ride prior to that dats

## 2014-07-14 NOTE — ED Notes (Signed)
Dental pain x 3 weeks. Seen in [facility] for same 12-6.

## 2014-07-14 NOTE — ED Provider Notes (Signed)
CSN: [id number]     Arrival date & time 07/14/14  1538 History   First MD Initiated Contact with Patient 07/14/14 1539     Chief Complaint  Patient presents with  . Dental Pain   (Consider location/radiation/quality/duration/timing/severity/associated sxs/prior Treatment) Patient is a 23 y.o. female presenting with tooth pain. The history is provided by the patient.  Dental Pain Location:  Lower Lower teeth location:  17/LL 3rd molar Quality:  Throbbing Severity:  Moderate Onset quality:  Gradual Duration:  1 month Progression:  Unchanged Chronicity:  New Context comment:  [facility] but out of pain meds, has appt 1/5 to have wisdom teeth removed   Past Medical History  Diagnosis Date  . Asthma   . Trichomonas   . Chlamydia   . Pseudotumor cerebri   . FAOZHYQM(578.4Headache(784.0)    Past Surgical History  Procedure Laterality Date  . Lumbar puncture     Family History  Problem Relation Age of Onset  . Anesthesia problems Neg Hx    History  Substance Use Topics  . Smoking status: Former Games developermoker  . Smokeless tobacco: Not on file  . Alcohol Use: 0.6 oz/week    1 Glasses of wine per week     Comment: Marijuana stopped a "few" months ago   OB History    Gravida Para Term Preterm AB TAB SAB Ectopic Multiple Living   1 1 1  0 0 0 0 0 0 1     Review of Systems  Constitutional: Negative.   HENT: Positive for dental problem.     Allergies  Shellfish allergy and Doxycycline  Home Medications   Prior to Admission medications   Medication Sig Start Date End Date Taking? Authorizing Provider  albuterol (PROVENTIL HFA;VENTOLIN HFA) 108 (90 BASE) MCG/ACT inhaler Inhale 1-2 puffs into the lungs every 4 (four) hours as needed for wheezing or shortness of breath. 08/21/13   Warnell Foresterrey Wofford, MD  clindamycin (CLEOCIN) 150 MG capsule Take 1 capsule (150 mg total) by mouth 4 (four) times daily. 07/02/14   Elson AreasLeslie K Sofia, PA-C  diclofenac (CATAFLAM) 50 MG tablet Take 1 tablet (50  mg total) by mouth 3 (three) times daily. 07/14/14   Linna HoffJames D Tomie Spizzirri, MD  HYDROcodone-acetaminophen (NORCO/VICODIN) 5-325 MG per tablet Take 1 tablet by mouth every 4 (four) hours as needed for moderate pain or severe pain. 07/14/14   Linna HoffJames D Loyal Rudy, MD  ibuprofen (ADVIL,MOTRIN) 800 MG tablet Take 1 tablet (800 mg total) by mouth 3 (three) times daily as needed for mild pain or moderate pain. 07/02/14   Elson AreasLeslie K Sofia, PA-C  traMADol (ULTRAM) 50 MG tablet Take 1 tablet (50 mg total) by mouth every 6 (six) hours as needed. Patient not taking: Reported on 07/02/2014 01/22/14   Brandt LoosenJulie Manly, MD   BP 106/70 mmHg  Pulse 82  Temp(Src) 98.5 F (36.9 C) (Oral)  SpO2 99%  LMP 06/30/2014 Physical Exam  Constitutional: She appears well-developed and well-nourished. No distress.  HENT:  Head: Normocephalic.  Right Ear: External ear normal.  Left Ear: External ear normal.  Mouth/Throat: Oropharynx is clear and moist and mucous membranes are normal.    Nursing note and vitals reviewed.   ED Course  Procedures (including critical care time) Labs Review Labs Reviewed - No data to display  Imaging Review No results found.   MDM   1. Pain, dental        Linna HoffJames D Sharonlee Nine, MD 07/14/14 [phone]

## 2014-07-14 NOTE — Discharge Instructions (Signed)
Take medicine as prescribed, see your dentist as soon as possible °

## 2015-03-22 ENCOUNTER — Emergency Department (HOSPITAL_COMMUNITY): Payer: Medicaid Other

## 2015-03-22 ENCOUNTER — Emergency Department (HOSPITAL_COMMUNITY)
Admission: EM | Admit: 2015-03-22 | Discharge: 2015-03-22 | Disposition: A | Discharge status: Home / Self Care | Payer: Medicaid Other | Attending: Emergency Medicine | Admitting: Emergency Medicine

## 2015-03-22 ENCOUNTER — Encounter (HOSPITAL_COMMUNITY): Payer: Self-pay | Admitting: Emergency Medicine

## 2015-03-22 DIAGNOSIS — Z87891 Personal history of nicotine dependence: Secondary | ICD-10-CM | POA: Insufficient documentation

## 2015-03-22 DIAGNOSIS — Z79899 Other long term (current) drug therapy: Secondary | ICD-10-CM | POA: Diagnosis not present

## 2015-03-22 DIAGNOSIS — Z8669 Personal history of other diseases of the nervous system and sense organs: Secondary | ICD-10-CM | POA: Diagnosis not present

## 2015-03-22 DIAGNOSIS — J4521 Mild intermittent asthma with (acute) exacerbation: Secondary | ICD-10-CM | POA: Insufficient documentation

## 2015-03-22 DIAGNOSIS — J4520 Mild intermittent asthma, uncomplicated: Secondary | ICD-10-CM

## 2015-03-22 DIAGNOSIS — Z8619 Personal history of other infectious and parasitic diseases: Secondary | ICD-10-CM | POA: Diagnosis not present

## 2015-03-22 DIAGNOSIS — R0602 Shortness of breath: Secondary | ICD-10-CM | POA: Diagnosis present

## 2015-03-22 MED ORDER — ALBUTEROL SULFATE HFA 108 (90 BASE) MCG/ACT IN AERS: 1.0000 | INHALATION_SPRAY | Freq: Four times a day (QID) | RESPIRATORY_TRACT | Status: DC | PRN

## 2015-03-22 MED ORDER — ALBUTEROL SULFATE (2.5 MG/3ML) 0.083% IN NEBU
5.0000 mg | INHALATION_SOLUTION | Freq: Once | RESPIRATORY_TRACT | Status: AC
  Administered 2015-03-22: 5 mg via RESPIRATORY_TRACT
  Filled 2015-03-22: qty 6

## 2015-03-22 NOTE — Discharge Instructions (Signed)
Take the prescribed medication as directed. Follow-up with your primary care physician. It may be of some benefit to have a nebulizer machine at home. Return to the ED for new or worsening symptoms.

## 2015-03-22 NOTE — ED Notes (Signed)
Per pt, states history of asthma, states recently quit smoking and having chest tightness on and off for weeks-states rescue inhaler has helped with symptoms

## 2015-03-22 NOTE — ED Provider Notes (Signed)
CSN: [id number]     Arrival date & time 03/22/15  1623 History   First MD Initiated Contact with Patient 03/22/15 1733     Chief Complaint  Patient presents with  . asthma symptoms      (Consider location/radiation/quality/duration/timing/severity/associated sxs/prior Treatment) The history is provided by the patient and medical records.   This is a 24 year old female with history of asthma, pseudotumor cerebri, frequent headaches, presenting to the ED for chest tightness and shortness of breath. Patient states she's recently tried to stop smoking marijuana, however she did smoke 2 days ago. She states since this time she has had chest tightness and shortness of breath which she describes as difficulty taking a deep breath. She denies any cough, fever, chills.  Patient uses albuterol at home, she used inhaler earlier today with some relief. Patient denies any known cardiac history. No family cardiac history. She does not smoke cigarettes.  VSS.  Past Medical History  Diagnosis Date  . Asthma   . Trichomonas   . Chlamydia   . Pseudotumor cerebri   . WUJWJXBJ(478.2)    Past Surgical History  Procedure Laterality Date  . Lumbar puncture     Family History  Problem Relation Age of Onset  . Anesthesia problems Neg Hx    Social History  Substance Use Topics  . Smoking status: Former Games developer  . Smokeless tobacco: None  . Alcohol Use: 0.6 oz/week    1 Glasses of wine per week     Comment: Marijuana stopped a "few" months ago   OB History    Gravida Para Term Preterm AB TAB SAB Ectopic Multiple Living   0 0 0 0 0 0 1     Review of Systems  Respiratory: Positive for chest tightness and shortness of breath.   All other systems reviewed and are negative.     Allergies  Shellfish allergy and Doxycycline  Home Medications   Prior to Admission medications   Medication Sig Start Date End Date Taking? Authorizing Provider  albuterol (PROVENTIL HFA;VENTOLIN HFA) 108 (90  BASE) MCG/ACT inhaler Inhale 1-2 puffs into the lungs every 4 (four) hours as needed for wheezing or shortness of breath. 08/21/13  Yes Blake Divine, MD  Cyanocobalamin (VITAMIN B-12 PO) Take 1 tablet by mouth daily.   Yes Historical Provider, MD  desonide (DESOWEN) 0.05 % cream Apply 1 application topically 2 (two) times daily. Apply to neck as directed. 03/16/15  Yes Historical Provider, MD  clindamycin (CLEOCIN) 150 MG capsule Take 1 capsule (150 mg total) by mouth 4 (four) times daily. Patient not taking: Reported on 03/22/2015 07/02/14   Elson Areas, PA-C  diclofenac (CATAFLAM) 50 MG tablet Take 1 tablet (50 mg total) by mouth 3 (three) times daily. Patient not taking: Reported on 03/22/2015 07/14/14   Linna Hoff, MD  HYDROcodone-acetaminophen (NORCO/VICODIN) 5-325 MG per tablet Take 1 tablet by mouth every 4 (four) hours as needed for moderate pain or severe pain. Patient not taking: Reported on 03/22/2015 07/14/14   Linna Hoff, MD  ibuprofen (ADVIL,MOTRIN) 800 MG tablet Take 1 tablet (800 mg total) by mouth 3 (three) times daily as needed for mild pain or moderate pain. Patient not taking: Reported on 03/22/2015 07/02/14   Elson Areas, PA-C  traMADol (ULTRAM) 50 MG tablet Take 1 tablet (50 mg total) by mouth every 6 (six) hours as needed. Patient not taking: Reported on 07/02/2014 01/22/14   Brandt Loosen, MD   BP 117/68  mmHg  Pulse 82  Temp(Src) 98.4 F (36.9 C) (Oral)  Resp 16  SpO2 99%   Physical Exam  Constitutional: She is oriented to person, place, and time. She appears well-developed and well-nourished. No distress.  HENT:  Head: Normocephalic and atraumatic.  Mouth/Throat: Oropharynx is clear and moist.  Eyes: Conjunctivae and EOM are normal. Pupils are equal, round, and reactive to light.  Neck: Normal range of motion. Neck supple.  Cardiovascular: Normal rate, regular rhythm and normal heart sounds.   Pulmonary/Chest: No respiratory distress. She has decreased breath  sounds. She has no wheezes. She has no rhonchi.  Respirations unlabored; decreased breath sounds throughout; no distress  Abdominal: Soft. Bowel sounds are normal. There is no tenderness. There is no guarding.  Musculoskeletal: Normal range of motion.  Neurological: She is alert and oriented to person, place, and time.  Skin: Skin is warm and dry. She is not diaphoretic.  Psychiatric: She has a normal mood and affect.  Nursing note and vitals reviewed.   ED Course  Procedures (including critical care time) Labs Review Labs Reviewed - No data to display  Imaging Review Dg Chest 2 View  03/22/2015   CLINICAL DATA:  Chest pain for the past week.  Shortness of breath.  EXAM: CHEST  2 VIEW  COMPARISON:  04/20/2009.  FINDINGS: Normal sized heart. Clear lungs with normal vascularity. Minimal diffuse peribronchial thickening. Normal appearing bones.  IMPRESSION: Minimal bronchitic changes.   Electronically Signed   By: Beckie Salts M.D.   On: 03/22/2015 18:33   I have personally reviewed and evaluated these images as part of my medical decision-making.   EKG Interpretation None      MDM   Final diagnoses:  Asthma, mild intermittent, uncomplicated   24 year old female here with chest tightness and shortness of breath which she describes as inability to take a deep breath. Patient does smoke marijuana frequently, however she is trying to quit. Patient is afebrile, nontoxic. She is in no acute respiratory distress. Vital signs are stable on room air. She has decreased breath sounds throughout without audible wheezes or rhonchi. EKG is nonischemic. Chest x-ray is clear. Patient was treated with albuterol nebulizer treatment with resolution of symptoms. On repeat evaluation she is in room talking on cell phone to family members, no distress. Sx likely due to her asthma, doubt ACS, PE, dissection, or other acute cardiac event.  Patient is PERC negative.  Patient is stable for discharge.  Will refill  her albuterol inhaler. I've encouraged her to follow-up with her PCP.  Discussed plan with patient, he/she acknowledged understanding and agreed with plan of care.  Return precautions given for new or worsening symptoms.   Garlon Hatchet, PA-C 03/22/15 1932  Arby Barrette, MD 04/04/15 [phone]

## 2015-03-22 NOTE — ED Notes (Signed)
Attempted blood draw, unable pt is difficult stick

## 2015-03-22 NOTE — ED Notes (Signed)
2 attempts on Blood draw. Unsuccessful. Kendal Hymen will attempt

## 2015-04-26 ENCOUNTER — Encounter (HOSPITAL_COMMUNITY): Payer: Self-pay | Admitting: Emergency Medicine

## 2015-04-26 ENCOUNTER — Emergency Department (HOSPITAL_COMMUNITY)
Admission: EM | Admit: 2015-04-26 | Discharge: 2015-04-26 | Disposition: A | Discharge status: Home / Self Care | Payer: No Typology Code available for payment source | Attending: Emergency Medicine | Admitting: Emergency Medicine

## 2015-04-26 ENCOUNTER — Emergency Department (HOSPITAL_COMMUNITY): Payer: No Typology Code available for payment source

## 2015-04-26 DIAGNOSIS — S161XXA Strain of muscle, fascia and tendon at neck level, initial encounter: Secondary | ICD-10-CM | POA: Insufficient documentation

## 2015-04-26 DIAGNOSIS — J45909 Unspecified asthma, uncomplicated: Secondary | ICD-10-CM | POA: Insufficient documentation

## 2015-04-26 DIAGNOSIS — Y92410 Unspecified street and highway as the place of occurrence of the external cause: Secondary | ICD-10-CM | POA: Insufficient documentation

## 2015-04-26 DIAGNOSIS — Y9389 Activity, other specified: Secondary | ICD-10-CM | POA: Insufficient documentation

## 2015-04-26 DIAGNOSIS — Z8669 Personal history of other diseases of the nervous system and sense organs: Secondary | ICD-10-CM | POA: Insufficient documentation

## 2015-04-26 DIAGNOSIS — Z87891 Personal history of nicotine dependence: Secondary | ICD-10-CM | POA: Insufficient documentation

## 2015-04-26 DIAGNOSIS — Z79899 Other long term (current) drug therapy: Secondary | ICD-10-CM | POA: Insufficient documentation

## 2015-04-26 DIAGNOSIS — Z8619 Personal history of other infectious and parasitic diseases: Secondary | ICD-10-CM | POA: Insufficient documentation

## 2015-04-26 DIAGNOSIS — S4992XA Unspecified injury of left shoulder and upper arm, initial encounter: Secondary | ICD-10-CM | POA: Insufficient documentation

## 2015-04-26 DIAGNOSIS — Y998 Other external cause status: Secondary | ICD-10-CM | POA: Insufficient documentation

## 2015-04-26 MED ORDER — OXYCODONE-ACETAMINOPHEN 5-325 MG PO TABS: 1.0000 | ORAL_TABLET | Freq: Four times a day (QID) | ORAL | Status: DC | PRN

## 2015-04-26 MED ORDER — NAPROXEN 500 MG PO TABS: 500.0000 mg | ORAL_TABLET | Freq: Two times a day (BID) | ORAL | Status: DC

## 2015-04-26 MED ORDER — CYCLOBENZAPRINE HCL 10 MG PO TABS
10.0000 mg | ORAL_TABLET | Freq: Once | ORAL | Status: AC
  Administered 2015-04-26: 10 mg via ORAL
  Filled 2015-04-26: qty 1

## 2015-04-26 MED ORDER — CYCLOBENZAPRINE HCL 10 MG PO TABS: 10.0000 mg | ORAL_TABLET | Freq: Two times a day (BID) | ORAL | Status: DC | PRN

## 2015-04-26 MED ORDER — HYDROCODONE-ACETAMINOPHEN 5-325 MG PO TABS
1.0000 | ORAL_TABLET | Freq: Once | ORAL | Status: AC
  Administered 2015-04-26: 1 via ORAL
  Filled 2015-04-26: qty 1
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
## 2015-04-26 NOTE — Discharge Instructions (Signed)
Do not drive if you are taking the muscle relaxant or the narcotic pain medication because they will make you sleepy. Follow up with Dr. Roda Shutters if symptoms persist.
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
## 2015-12-12 DIAGNOSIS — J069 Acute upper respiratory infection, unspecified: Secondary | ICD-10-CM | POA: Insufficient documentation

## 2015-12-12 DIAGNOSIS — Z87891 Personal history of nicotine dependence: Secondary | ICD-10-CM | POA: Insufficient documentation

## 2015-12-13 ENCOUNTER — Emergency Department (HOSPITAL_COMMUNITY): Payer: Self-pay

## 2015-12-13 ENCOUNTER — Emergency Department (HOSPITAL_COMMUNITY)
Admission: EM | Admit: 2015-12-13 | Discharge: 2015-12-13 | Disposition: A | Discharge status: Home / Self Care | Payer: Self-pay | Attending: Emergency Medicine | Admitting: Emergency Medicine

## 2015-12-13 ENCOUNTER — Encounter (HOSPITAL_COMMUNITY): Payer: Self-pay | Admitting: Emergency Medicine

## 2015-12-13 DIAGNOSIS — B9789 Other viral agents as the cause of diseases classified elsewhere: Secondary | ICD-10-CM

## 2015-12-13 DIAGNOSIS — J069 Acute upper respiratory infection, unspecified: Secondary | ICD-10-CM

## 2015-12-13 MED ORDER — SALINE SPRAY 0.65 % NA SOLN: 1.0000 | NASAL | Status: DC | PRN

## 2015-12-13 MED ORDER — BENZONATATE 100 MG PO CAPS: 100.0000 mg | ORAL_CAPSULE | Freq: Three times a day (TID) | ORAL | Status: DC | PRN

## 2015-12-13 MED ORDER — ALBUTEROL SULFATE HFA 108 (90 BASE) MCG/ACT IN AERS
2.0000 | INHALATION_SPRAY | Freq: Once | RESPIRATORY_TRACT | Status: AC
Start: 2015-12-13 — End: 2015-12-13
  Administered 2015-12-13: 2 via RESPIRATORY_TRACT
  Filled 2015-12-13: qty 6.7

## 2015-12-13 MED ORDER — HYDROCODONE-HOMATROPINE 5-1.5 MG/5ML PO SYRP: 5.0000 mL | ORAL_SOLUTION | Freq: Every evening | ORAL | Status: DC | PRN

## 2015-12-13 MED ORDER — PREDNISONE 20 MG PO TABS: 40.0000 mg | ORAL_TABLET | Freq: Every day | ORAL | Status: DC

## 2015-12-13 NOTE — ED Provider Notes (Signed)
CSN: [id number]     Arrival date & time 12/12/15  2340 History   First MD Initiated Contact with Patient 12/13/15 0109     Chief Complaint  Patient presents with  . Asthma  . Cough     (Consider location/radiation/quality/duration/timing/severity/associated sxs/prior Treatment) HPI Comments: 25 year old female presents to the emergency department for evaluation of a cough which began 2 days ago. She states that cough is mostly dry, but occasionally it is productive of sputum. She has had increased sneezing as well as chest tightness associated with coughing. She does have a history of asthma, but has been out of her albuterol medications. She complains further of nasal congestion as well as generalized fatigue. She has had a hoarse voice without inability to swallow or drooling. No fevers, vomiting, diarrhea, or sick contacts. She has been taking some allergy tablets without relief. Symptoms have been constant since onset; no modifying factors.  Patient is a 25 y.o. female presenting with asthma and cough. The history is provided by the patient. No language interpreter was used.  Asthma Associated symptoms include congestion, coughing and a sore throat. Pertinent negatives include no fever or vomiting.  Cough Associated symptoms: sore throat   Associated symptoms: no fever     Past Medical History  Diagnosis Date  . Asthma   . Trichomonas   . Chlamydia   . Pseudotumor cerebri   . VHQIONGE(952.8)    Past Surgical History  Procedure Laterality Date  . Lumbar puncture     Family History  Problem Relation Age of Onset  . Anesthesia problems Neg Hx    Social History  Substance Use Topics  . Smoking status: Former Games developer  . Smokeless tobacco: None  . Alcohol Use: 0.6 oz/week    1 Glasses of wine per week     Comment: Marijuana stopped a "few" months ago   OB History    Gravida Para Term Preterm AB TAB SAB Ectopic Multiple Living   0 0 0 0 0 0 1      Review of  Systems  Constitutional: Negative for fever.  HENT: Positive for congestion and sore throat. Negative for trouble swallowing.   Respiratory: Positive for cough and chest tightness.   Gastrointestinal: Negative for vomiting and diarrhea.  All other systems reviewed and are negative.   Allergies  Shellfish allergy and Doxycycline  Home Medications   Prior to Admission medications   Medication Sig Start Date End Date Taking? Authorizing Provider  Multiple Vitamins-Minerals (MULTIVITAMIN WITH MINERALS) tablet Take 1 tablet by mouth daily.   Yes Historical Provider, MD  naproxen (NAPROSYN) 500 MG tablet Take 1 tablet (500 mg total) by mouth 2 (two) times daily. 04/26/15  Yes Hope Orlene Och, NP  albuterol (PROVENTIL HFA;VENTOLIN HFA) 108 (90 BASE) MCG/ACT inhaler Inhale 1-2 puffs into the lungs every 6 (six) hours as needed for wheezing. Patient not taking: Reported on 12/13/2015 03/22/15   Garlon Hatchet, PA-C  benzonatate (TESSALON) 100 MG capsule Take 1 capsule (100 mg total) by mouth 3 (three) times daily as needed for cough. 12/13/15   Antony Madura, PA-C  clindamycin (CLEOCIN) 150 MG capsule Take 1 capsule (150 mg total) by mouth 4 (four) times daily. Patient not taking: Reported on 03/22/2015 07/02/14   Elson Areas, PA-C  cyclobenzaprine (FLEXERIL) 10 MG tablet Take 1 tablet (10 mg total) by mouth 2 (two) times daily as needed for muscle spasms. 04/26/15   Hope Orlene Och, NP  HYDROcodone-homatropine [facility])  5-1.5 MG/5ML syrup Take 5 mLs by mouth at bedtime as needed for cough. 12/13/15   Antony MaduraKelly Leilyn Frayre, PA-C  oxyCODONE-acetaminophen (ROXICET) 5-325 MG tablet Take 1 tablet by mouth every 6 (six) hours as needed for severe pain. 04/26/15   Hope Orlene OchM Neese, NP  predniSONE (DELTASONE) 20 MG tablet Take 2 tablets (40 mg total) by mouth daily. 12/13/15   Antony MaduraKelly Atziry Baranski, PA-C  sodium chloride (OCEAN) 0.65 % SOLN nasal spray Place 1 spray into both nostrils as needed for congestion. 12/13/15   Antony MaduraKelly Rubee Vega, PA-C   BP  103/69 mmHg  Pulse 87  Temp(Src) 98.5 F (36.9 C) (Oral)  Resp 18  SpO2 98%  LMP 12/01/2015 (Approximate) Physical Exam  Constitutional: She is oriented to person, place, and time. She appears well-developed and well-nourished. No distress.  Nontoxic appearing  HENT:  Head: Normocephalic and atraumatic.  Mouth/Throat: Oropharynx is clear and moist. No oropharyngeal exudate.  Uvula midline. Mild posterior oropharyngeal erythema. No edema or exudates. Patient tolerating secretions without difficulty.  Eyes: Conjunctivae and EOM are normal. No scleral icterus.  Neck: Normal range of motion.  Cardiovascular: Normal rate, regular rhythm and intact distal pulses.   Pulmonary/Chest: Effort normal and breath sounds normal. No respiratory distress. She has no wheezes. She has no rales.  Respirations even and unlabored. No tachypnea or dyspnea. Lungs clear to auscultation bilaterally.  Musculoskeletal: Normal range of motion.  Neurological: She is alert and oriented to person, place, and time. She exhibits normal muscle tone. Coordination normal.  Ambulatory in the exam room.  Skin: Skin is warm and dry. No rash noted. She is not diaphoretic. No erythema. No pallor.  Psychiatric: She has a normal mood and affect. Her behavior is normal.  Nursing note and vitals reviewed.   ED Course  Procedures (including critical care time) Labs Review Labs Reviewed - No data to display  Imaging Review Dg Chest 2 View  12/13/2015  CLINICAL DATA:  25 year old female with cough and wheezing EXAM: CHEST  2 VIEW COMPARISON:  Radiograph dated 03/22/2015 FINDINGS: Two views of the chest demonstrates minimal interstitial prominence similar to prior study. There is no focal consolidation, pleural effusion, or pneumothorax. The cardiac silhouette is within normal limits. No acute osseous pathology. IMPRESSION: No active cardiopulmonary disease. Electronically Signed   By: Elgie CollardArash  Radparvar M.D.   On: 12/13/2015 01:06    I have personally reviewed and evaluated these images and lab results as part of my medical decision-making.   EKG Interpretation None      MDM   Final diagnoses:  Viral URI with cough    Pt CXR negative for acute infiltrate. Patient's symptoms are consistent with URI, likely viral etiology. Discussed that antibiotics are not indicated for viral infections. Pt will be discharged with symptomatic treatment. She verbalizes understanding and is agreeable with plan. Pt is hemodynamically stable and in NAD prior to discharge.   Filed Vitals:   12/13/15 0040  BP: 103/69  Pulse: 87  Temp: 98.5 F (36.9 C)  TempSrc: Oral  Resp: 18  SpO2: 98%        Antony MaduraKelly Anuja Manka, PA-C 12/13/15 0250  Tomasita CrumbleAdeleke Oni, MD 12/13/15 [phone]

## 2015-12-13 NOTE — ED Notes (Signed)
Patient presents for SOB, CP, sore throat, non productive cough x2-3 days. Denies fever, N/V.

## 2015-12-13 NOTE — Discharge Instructions (Signed)
Use an albuterol inhaler every 4-6 hours as needed for cough and chest tightness. Take prednisone as prescribed and Tessalon for cough. You may use hycodan at nighttime for persistent cough.  Upper Respiratory Infection, Adult Most upper respiratory infections (URIs) are a viral infection of the air passages leading to the lungs. A URI affects the nose, throat, and upper air passages. The most common type of URI is nasopharyngitis and is typically referred to as "the common cold." URIs run their course and usually go away on their own. Most of the time, a URI does not require medical attention, but sometimes a bacterial infection in the upper airways can follow a viral infection. This is called a secondary infection. Sinus and middle ear infections are common types of secondary upper respiratory infections. Bacterial pneumonia can also complicate a URI. A URI can worsen asthma and chronic obstructive pulmonary disease (COPD). Sometimes, these complications can require emergency medical care and may be life threatening.  CAUSES Almost all URIs are caused by viruses. A virus is a type of germ and can spread from one person to another.  RISKS FACTORS You may be at risk for a URI if:   You smoke.   You have chronic heart or lung disease.  You have a weakened defense (immune) system.   You are very young or very old.   You have nasal allergies or asthma.  You work in crowded or poorly ventilated areas.  You work in health care facilities or schools. SIGNS AND SYMPTOMS  Symptoms typically develop 2-3 days after you come in contact with a cold virus. Most viral URIs last 7-10 days. However, viral URIs from the influenza virus (flu virus) can last 14-18 days and are typically more severe. Symptoms may include:   Runny or stuffy (congested) nose.   Sneezing.   Cough.   Sore throat.   Headache.   Fatigue.   Fever.   Loss of appetite.   Pain in your forehead, behind your  eyes, and over your cheekbones (sinus pain).  Muscle aches.  DIAGNOSIS  Your health care provider may diagnose a URI by:  Physical exam.  Tests to check that your symptoms are not due to another condition such as:  Strep throat.  Sinusitis.  Pneumonia.  Asthma. TREATMENT  A URI goes away on its own with time. It cannot be cured with medicines, but medicines may be prescribed or recommended to relieve symptoms. Medicines may help:  Reduce your fever.  Reduce your cough.  Relieve nasal congestion. HOME CARE INSTRUCTIONS   Take medicines only as directed by your health care provider.   Gargle warm saltwater or take cough drops to comfort your throat as directed by your health care provider.  Use a warm mist humidifier or inhale steam from a shower to increase air moisture. This may make it easier to breathe.  Drink enough fluid to keep your urine clear or pale yellow.   Eat soups and other clear broths and maintain good nutrition.   Rest as needed.   Return to work when your temperature has returned to normal or as your health care provider advises. You may need to stay home longer to avoid infecting others. You can also use a face mask and careful hand washing to prevent spread of the virus.  Increase the usage of your inhaler if you have asthma.   Do not use any tobacco products, including cigarettes, chewing tobacco, or electronic cigarettes. If you need help quitting, ask  your health care provider. PREVENTION  The best way to protect yourself from getting a cold is to practice good hygiene.   Avoid oral or hand contact with people with cold symptoms.   Wash your hands often if contact occurs.  There is no clear evidence that vitamin C, vitamin E, echinacea, or exercise reduces the chance of developing a cold. However, it is always recommended to get plenty of rest, exercise, and practice good nutrition.  SEEK MEDICAL CARE IF:   You are getting worse  rather than better.   Your symptoms are not controlled by medicine.   You have chills.  You have worsening shortness of breath.  You have brown or red mucus.  You have yellow or brown nasal discharge.  You have pain in your face, especially when you bend forward.  You have a fever.  You have swollen neck glands.  You have pain while swallowing.  You have white areas in the back of your throat. SEEK IMMEDIATE MEDICAL CARE IF:   You have severe or persistent:  Headache.  Ear pain.  Sinus pain.  Chest pain.  You have chronic lung disease and any of the following:  Wheezing.  Prolonged cough.  Coughing up blood.  A change in your usual mucus.  You have a stiff neck.  You have changes in your:  Vision.  Hearing.  Thinking.  Mood. MAKE SURE YOU:   Understand these instructions.  Will watch your condition.  Will get help right away if you are not doing well or get worse.   This information is not intended to replace advice given to you by your health care provider. Make sure you discuss any questions you have with your health care provider.   Document Released: 01/07/2001 Document Revised: 11/28/2014 Document Reviewed: 10/19/2013 Elsevier Interactive Patient Education 2016 Elsevier Inc.  Cough, Adult Coughing is a reflex that clears your throat and your airways. Coughing helps to heal and protect your lungs. It is normal to cough occasionally, but a cough that happens with other symptoms or lasts a long time may be a sign of a condition that needs treatment. A cough may last only 2-3 weeks (acute), or it may last longer than 8 weeks (chronic). CAUSES Coughing is commonly caused by:  Breathing in substances that irritate your lungs.  A viral or bacterial respiratory infection.  Allergies.  Asthma.  Postnasal drip.  Smoking.  Acid backing up from the stomach into the esophagus (gastroesophageal reflux).  Certain medicines.  Chronic  lung problems, including COPD (or rarely, lung cancer).  Other medical conditions such as heart failure. HOME CARE INSTRUCTIONS  Pay attention to any changes in your symptoms. Take these actions to help with your discomfort:  Take medicines only as told by your health care provider.  If you were prescribed an antibiotic medicine, take it as told by your health care provider. Do not stop taking the antibiotic even if you start to feel better.  Talk with your health care provider before you take a cough suppressant medicine.  Drink enough fluid to keep your urine clear or pale yellow.  If the air is dry, use a cold steam vaporizer or humidifier in your bedroom or your home to help loosen secretions.  Avoid anything that causes you to cough at work or at home.  If your cough is worse at night, try sleeping in a semi-upright position.  Avoid cigarette smoke. If you smoke, quit smoking. If you need help quitting, ask  your health care provider.  Avoid caffeine.  Avoid alcohol.  Rest as needed. SEEK MEDICAL CARE IF:   You have new symptoms.  You cough up pus.  Your cough does not get better after 2-3 weeks, or your cough gets worse.  You cannot control your cough with suppressant medicines and you are losing sleep.  You develop pain that is getting worse or pain that is not controlled with pain medicines.  You have a fever.  You have unexplained weight loss.  You have night sweats. SEEK IMMEDIATE MEDICAL CARE IF:  You cough up blood.  You have difficulty breathing.  Your heartbeat is very fast.   This information is not intended to replace advice given to you by your health care provider. Make sure you discuss any questions you have with your health care provider.   Document Released: 01/10/2011 Document Revised: 04/04/2015 Document Reviewed: 09/20/2014 Elsevier Interactive Patient Education 2016 [employer].  [employer] Vaporizers Vaporizers may help relieve the  symptoms of a cough and cold. They add moisture to the air, which helps mucus to become thinner and less sticky. This makes it easier to breathe and cough up secretions. Cool mist vaporizers do not cause serious burns like hot mist vaporizers, which may also be called steamers or humidifiers. Vaporizers have not been proven to help with colds. You should not use a vaporizer if you are allergic to mold. HOME CARE INSTRUCTIONS  Follow the package instructions for the vaporizer.  Do not use anything other than distilled water in the vaporizer.  Do not run the vaporizer all of the time. This can cause mold or bacteria to grow in the vaporizer.  Clean the vaporizer after each time it is used.  Clean and dry the vaporizer well before storing it.  Stop using the vaporizer if worsening respiratory symptoms develop.   This information is not intended to replace advice given to you by your health care provider. Make sure you discuss any questions you have with your health care provider.   Document Released: 04/10/2004 Document Revised: 07/19/2013 Document Reviewed: 12/01/2012 Elsevier Interactive Patient Education [employer]

## 2015-12-13 NOTE — ED Notes (Signed)
Bed: WA06 Expected date:  Expected time:  Means of arrival:  Comments: 

## 2016-04-11 LAB — GLUCOSE, POCT (MANUAL RESULT ENTRY): POC GLUCOSE: 80 mg/dL (ref 70–99)

## 2016-08-06 ENCOUNTER — Inpatient Hospital Stay (HOSPITAL_COMMUNITY)
Admission: AD | Admit: 2016-08-06 | Discharge: 2016-08-06 | Disposition: A | Discharge status: Home / Self Care | Payer: Medicaid Other | Source: Ambulatory Visit | Attending: Obstetrics and Gynecology | Admitting: Obstetrics and Gynecology

## 2016-08-06 ENCOUNTER — Encounter (HOSPITAL_COMMUNITY): Payer: Self-pay

## 2016-08-06 DIAGNOSIS — B373 Candidiasis of vulva and vagina: Secondary | ICD-10-CM | POA: Diagnosis not present

## 2016-08-06 DIAGNOSIS — Z91013 Allergy to seafood: Secondary | ICD-10-CM | POA: Diagnosis not present

## 2016-08-06 DIAGNOSIS — R109 Unspecified abdominal pain: Secondary | ICD-10-CM | POA: Diagnosis present

## 2016-08-06 DIAGNOSIS — Z79899 Other long term (current) drug therapy: Secondary | ICD-10-CM | POA: Insufficient documentation

## 2016-08-06 DIAGNOSIS — Z3202 Encounter for pregnancy test, result negative: Secondary | ICD-10-CM

## 2016-08-06 DIAGNOSIS — Z87891 Personal history of nicotine dependence: Secondary | ICD-10-CM | POA: Insufficient documentation

## 2016-08-06 DIAGNOSIS — B3731 Acute candidiasis of vulva and vagina: Secondary | ICD-10-CM

## 2016-08-06 LAB — URINALYSIS, ROUTINE W REFLEX MICROSCOPIC
Bilirubin Urine: NEGATIVE
Glucose, UA: NEGATIVE mg/dL
Hgb urine dipstick: NEGATIVE
Ketones, ur: NEGATIVE mg/dL
Nitrite: NEGATIVE
PH: 6 (ref 5.0–8.0)
Protein, ur: NEGATIVE mg/dL
SPECIFIC GRAVITY, URINE: 1.021 (ref 1.005–1.030)

## 2016-08-06 LAB — WET PREP, GENITAL
CLUE CELLS WET PREP: NONE SEEN
SPERM: NONE SEEN
Trich, Wet Prep: NONE SEEN

## 2016-08-06 LAB — POCT PREGNANCY, URINE: PREG TEST UR: NEGATIVE

## 2016-08-06 MED ORDER — FLUCONAZOLE 150 MG PO TABS: 150.0000 mg | ORAL_TABLET | Freq: Every day | ORAL | 1 refills | Status: DC

## 2016-08-06 NOTE — MAU Note (Signed)
Pt states she did not take a home pregnancy test and wants to know if we can draw a blood pregnancy test. Also wants to be tested for infection.

## 2016-08-06 NOTE — Discharge Instructions (Signed)

## 2016-08-06 NOTE — MAU Note (Signed)
Pt C/O lower abd pain for the past week, is nauseated, had bleeding with wiping, none today.  Denies dysuria, vomiting or diarrhea.

## 2016-08-06 NOTE — MAU Provider Note (Signed)
History     CSN: [id number]  Arrival date and time: 08/06/16 8250   First Provider Initiated Contact with Patient 08/06/16 1030    Chief Complaint  Patient presents with  . Abdominal Pain   Ariana Bentley is a 26 y.o. [id number] female who presents for abdominal cramping & vaginal spotting. Also concerned about possible pregnancy although she has not missed her period & did not take a pregnancy test at home. Was seen at [facility] on Friday for STD testing; states they haven't called her with results & she wants to be reswabbed.    Female GU Problem  The patient's primary symptoms include vaginal bleeding and vaginal discharge. The patient's pertinent negatives include no genital itching, genital lesions or missed menses. Primary symptoms comment: abdominal cramping. This is a new problem. Episode onset: 1 episode of pink spotting on toilet paper 2 days ago; intermittent lower abdominal cramping x 1 week. The problem has been unchanged. The pain is mild (rates 4/10 when it occurs). She is not pregnant. Associated symptoms include abdominal pain, constipation (last BM this morning; no issues) and nausea. Pertinent negatives include no chills, diarrhea, dysuria, fever, frequency, painful intercourse or vomiting. Associated symptoms comments: No dyspareunia. The vaginal discharge was thin and white. The vaginal bleeding is spotting. She has not been passing clots. She has not been passing tissue. Nothing aggravates the symptoms. She has tried nothing for the symptoms. She is sexually active. It is unknown whether or not her partner has an STD. Contraceptive use: occasional condom usage. Her menstrual history has been regular. Her past medical history is significant for an STD and vaginosis.   OB History    Gravida Para Term Preterm AB Living   '2 1 1 '$ 0 1 1   SAB TAB Ectopic Multiple Live Births   1 0 0 0 1      Past Medical History:  Diagnosis Date  . Asthma   . Chlamydia   .  Headache(784.0)   . Pseudotumor cerebri   . Trichomonas     Past Surgical History:  Procedure Laterality Date  . LUMBAR PUNCTURE      Family History  Problem Relation Age of Onset  . Anesthesia problems Neg Hx     Social History  Substance Use Topics  . Smoking status: Former Research scientist (life sciences)  . Smokeless tobacco: Never Used  . Alcohol use 0.6 oz/week    1 Glasses of wine per week     Comment: Marijuana stopped a "few" months ago    Allergies:  Allergies  Allergen Reactions  . Shellfish Allergy Anaphylaxis  . Doxycycline Other (See Comments)    Causes swelling behind the eyes; aggravates PTC condition    Prescriptions Prior to Admission  Medication Sig Dispense Refill Last Dose  . ibuprofen (ADVIL,MOTRIN) 200 MG tablet Take 800 mg by mouth every 6 (six) hours as needed for headache, mild pain or cramping.   07/18/2016  . Multiple Vitamins-Minerals (MULTIVITAMIN WITH MINERALS) tablet Take 1 tablet by mouth daily.   08/06/2016 at Unknown time  . albuterol (PROVENTIL HFA;VENTOLIN HFA) 108 (90 BASE) MCG/ACT inhaler Inhale 1-2 puffs into the lungs every 6 (six) hours as needed for wheezing. (Patient not taking: Reported on 12/13/2015) 1 Inhaler 0 emergency    Review of Systems  Constitutional: Negative for chills and fever.  Gastrointestinal: Positive for abdominal pain, constipation (last BM this morning; no issues) and nausea. Negative for diarrhea and vomiting.  Genitourinary: Positive for vaginal bleeding and vaginal discharge.  Negative for difficulty urinating, dysuria, frequency and missed menses.   Physical Exam   Blood pressure 116/61, pulse 102, temperature 98.5 F (36.9 C), temperature source Oral, resp. rate 18, height _0  (1.676 m), weight 229 lb (103.9 kg), last menstrual period 07/10/2016.  Physical Exam  Nursing note and vitals reviewed. Constitutional: She is oriented to person, place, and time. She appears well-developed and well-nourished. No distress.  HENT:   Head: Normocephalic and atraumatic.  Eyes: Conjunctivae are normal. Right eye exhibits no discharge. Left eye exhibits no discharge. No scleral icterus.  Neck: Normal range of motion.  Cardiovascular: Normal rate, regular rhythm and normal heart sounds.   No murmur heard. Respiratory: Effort normal and breath sounds normal. No respiratory distress. She has no wheezes.  GI: Soft. Bowel sounds are normal. She exhibits no distension. There is no tenderness. There is no rebound and no guarding.  Genitourinary: Uterus normal. Cervix exhibits no motion tenderness and no friability. No bleeding in the vagina. Vaginal discharge (small amount of thin yellow discharge) found.  Neurological: She is alert and oriented to person, place, and time.  Skin: Skin is warm and dry. She is not diaphoretic.  Psychiatric: She has a normal mood and affect. Her behavior is normal. Judgment and thought content normal.    MAU Course  Procedures Results for orders placed or performed during the hospital encounter of 08/06/16 (from the past 24 hour(s))  Urinalysis, Routine w reflex microscopic (not at [facility])     Status: Abnormal   Collection Time: 08/06/16  9:45 AM  Result Value Ref Range   Color, Urine YELLOW YELLOW   APPearance HAZY (A) CLEAR   Specific Gravity, Urine 1.021 1.005 - 1.030   pH 6.0 5.0 - 8.0   Glucose, UA NEGATIVE NEGATIVE mg/dL   Hgb urine dipstick NEGATIVE NEGATIVE   Bilirubin Urine NEGATIVE NEGATIVE   Ketones, ur NEGATIVE NEGATIVE mg/dL   Protein, ur NEGATIVE NEGATIVE mg/dL   Nitrite NEGATIVE NEGATIVE   Leukocytes, UA LARGE (A) NEGATIVE   RBC / HPF 0-5 0 - 5 RBC/hpf   WBC, UA 6-30 0 - 5 WBC/hpf   Bacteria, UA RARE (A) NONE SEEN   Squamous Epithelial / LPF 6-30 (A) NONE SEEN   Mucous PRESENT   Pregnancy, urine POC     Status: None   Collection Time: 08/06/16 10:24 AM  Result Value Ref Range   Preg Test, Ur NEGATIVE NEGATIVE  Wet prep, genital     Status: Abnormal   Collection Time:  08/06/16 10:42 AM  Result Value Ref Range   Yeast Wet Prep HPF POC PRESENT (A) NONE SEEN   Trich, Wet Prep NONE SEEN NONE SEEN   Clue Cells Wet Prep HPF POC NONE SEEN NONE SEEN   WBC, Wet Prep HPF POC TOO NUMEROUS TO COUNT (A) NONE SEEN   Sperm NONE SEEN     MDM Negative UPT GC/CT & wet prep Pt declines blood testing for STIs as she just had this done Pt concerned for possible UTI -- no urinary symptoms -- u/a sent for culture Assessment and Plan  A: 1. Vaginal yeast infection   2. Negative pregnancy test    P: Discharge home Rx diflucan GC/CT & urine culture pending F/u with PCP as needed  Jorje Guild 08/06/2016, 10:29 AM

## 2016-08-07 LAB — URINE CULTURE: Culture: 60000 — AB

## 2016-08-07 LAB — GC/CHLAMYDIA PROBE AMP (~~LOC~~) NOT AT ARMC
CHLAMYDIA, DNA PROBE: NEGATIVE
NEISSERIA GONORRHEA: NEGATIVE

## 2016-09-17 ENCOUNTER — Ambulatory Visit: Payer: Medicaid Other | Admitting: Obstetrics & Gynecology

## 2016-10-04 ENCOUNTER — Encounter (HOSPITAL_COMMUNITY): Payer: Self-pay | Admitting: Nurse Practitioner

## 2016-10-04 ENCOUNTER — Emergency Department (HOSPITAL_COMMUNITY): Payer: Medicaid Other

## 2016-10-04 ENCOUNTER — Emergency Department (HOSPITAL_COMMUNITY)
Admission: EM | Admit: 2016-10-04 | Discharge: 2016-10-04 | Disposition: A | Discharge status: Home / Self Care | Payer: Medicaid Other | Attending: Emergency Medicine | Admitting: Emergency Medicine

## 2016-10-04 DIAGNOSIS — Z79899 Other long term (current) drug therapy: Secondary | ICD-10-CM | POA: Insufficient documentation

## 2016-10-04 DIAGNOSIS — J45909 Unspecified asthma, uncomplicated: Secondary | ICD-10-CM | POA: Diagnosis not present

## 2016-10-04 DIAGNOSIS — J111 Influenza due to unidentified influenza virus with other respiratory manifestations: Secondary | ICD-10-CM | POA: Insufficient documentation

## 2016-10-04 LAB — COMPREHENSIVE METABOLIC PANEL
ALT: 16 U/L (ref 14–54)
AST: 20 U/L (ref 15–41)
Albumin: 3.9 g/dL (ref 3.5–5.0)
Alkaline Phosphatase: 38 U/L (ref 38–126)
Anion gap: 9 (ref 5–15)
BUN: 7 mg/dL (ref 6–20)
CALCIUM: 8.9 mg/dL (ref 8.9–10.3)
CHLORIDE: 107 mmol/L (ref 101–111)
CO2: 21 mmol/L — AB (ref 22–32)
Creatinine, Ser: 0.8 mg/dL (ref 0.44–1.00)
GFR calc non Af Amer: >60 mL/min (ref >60)
Glucose, Bld: 90 mg/dL (ref 65–99)
POTASSIUM: 3.9 mmol/L (ref 3.5–5.1)
SODIUM: 137 mmol/L (ref 135–145)
Total Bilirubin: 1 mg/dL (ref 0.3–1.2)
Total Protein: 7.1 g/dL (ref 6.5–8.1)

## 2016-10-04 LAB — CBC WITH DIFFERENTIAL/PLATELET
BASOS PCT: 0 %
Basophils Absolute: 0 10*3/uL (ref 0.0–0.1)
Eosinophils Absolute: 0 10*3/uL (ref 0.0–0.7)
Eosinophils Relative: 0 %
HCT: 38.7 % (ref 36.0–46.0)
Hemoglobin: 13.4 g/dL (ref 12.0–15.0)
Lymphocytes Relative: 14 %
Lymphs Abs: 0.5 10*3/uL — ABNORMAL LOW (ref 0.7–4.0)
MCH: 28.1 pg (ref 26.0–34.0)
MCHC: 34.6 g/dL (ref 30.0–36.0)
MCV: 81.1 fL (ref 78.0–100.0)
MONOS PCT: 13 %
Monocytes Absolute: 0.5 10*3/uL (ref 0.1–1.0)
NEUTROS ABS: 2.8 10*3/uL (ref 1.7–7.7)
Neutrophils Relative %: 73 %
Platelets: 146 10*3/uL — ABNORMAL LOW (ref 150–400)
RBC: 4.77 MIL/uL (ref 3.87–5.11)
RDW: 13 % (ref 11.5–15.5)
WBC: 3.8 10*3/uL — ABNORMAL LOW (ref 4.0–10.5)

## 2016-10-04 LAB — URINALYSIS, ROUTINE W REFLEX MICROSCOPIC
Bacteria, UA: NONE SEEN
Bilirubin Urine: NEGATIVE
Glucose, UA: NEGATIVE mg/dL
Hgb urine dipstick: NEGATIVE
KETONES UR: 20 mg/dL — AB
NITRITE: NEGATIVE
Protein, ur: NEGATIVE mg/dL
Specific Gravity, Urine: 1.013 (ref 1.005–1.030)
pH: 7 (ref 5.0–8.0)

## 2016-10-04 LAB — I-STAT BETA HCG BLOOD, ED (MC, WL, AP ONLY): I-stat hCG, quantitative: <5 m[IU]/mL (ref <5)

## 2016-10-04 LAB — RAPID STREP SCREEN (MED CTR MEBANE ONLY): Streptococcus, Group A Screen (Direct): NEGATIVE

## 2016-10-04 LAB — INFLUENZA PANEL BY PCR (TYPE A & B)
Influenza A By PCR: POSITIVE — AB
Influenza B By PCR: NEGATIVE

## 2016-10-04 LAB — I-STAT CG4 LACTIC ACID, ED: LACTIC ACID, VENOUS: 0.7 mmol/L (ref 0.5–1.9)

## 2016-10-04 MED ORDER — ACETAMINOPHEN 500 MG PO TABS: 1000.0000 mg | ORAL_TABLET | Freq: Four times a day (QID) | ORAL | 0 refills | Status: AC | PRN

## 2016-10-04 MED ORDER — SODIUM CHLORIDE 0.9 % IV SOLN
1000.0000 mL | INTRAVENOUS | Status: DC
  Administered 2016-10-04: 1000 mL via INTRAVENOUS

## 2016-10-04 MED ORDER — KETOROLAC TROMETHAMINE 30 MG/ML IJ SOLN
30.0000 mg | Freq: Once | INTRAMUSCULAR | Status: AC
  Administered 2016-10-04: 30 mg via INTRAVENOUS
  Filled 2016-10-04: qty 1

## 2016-10-04 MED ORDER — ONDANSETRON HCL 4 MG/2ML IJ SOLN
4.0000 mg | Freq: Once | INTRAMUSCULAR | Status: AC
  Administered 2016-10-04: 4 mg via INTRAVENOUS
  Filled 2016-10-04: qty 2

## 2016-10-04 MED ORDER — OSELTAMIVIR PHOSPHATE 75 MG PO CAPS: 75.0000 mg | ORAL_CAPSULE | Freq: Two times a day (BID) | ORAL | 0 refills | Status: DC

## 2016-10-04 MED ORDER — ALBUTEROL SULFATE HFA 108 (90 BASE) MCG/ACT IN AERS: 1.0000 | INHALATION_SPRAY | Freq: Four times a day (QID) | RESPIRATORY_TRACT | 0 refills | Status: DC | PRN

## 2016-10-04 MED ORDER — ONDANSETRON 4 MG PO TBDP: 4.0000 mg | ORAL_TABLET | ORAL | 0 refills | Status: DC | PRN

## 2016-10-04 MED ORDER — SODIUM CHLORIDE 0.9 % IV SOLN
1000.0000 mL | Freq: Once | INTRAVENOUS | Status: AC
  Administered 2016-10-04: 1000 mL via INTRAVENOUS

## 2016-10-04 MED ORDER — IBUPROFEN 800 MG PO TABS: 800.0000 mg | ORAL_TABLET | Freq: Three times a day (TID) | ORAL | 0 refills | Status: DC

## 2016-10-04 MED ORDER — OSELTAMIVIR PHOSPHATE 75 MG PO CAPS
75.0000 mg | ORAL_CAPSULE | Freq: Once | ORAL | Status: AC
  Administered 2016-10-04: 75 mg via ORAL
  Filled 2016-10-04: qty 1

## 2016-10-04 NOTE — ED Provider Notes (Signed)
[facility]-EMER[facility]ENCY DEPT Provider Note   CSN: [id number] Arrival date & time: 10/04/16  1105     History   Chief Complaint Chief Complaint  Patient presents with  . Influenza    HPI Ariana Bentley is a 26 y.o. female.  HPI Patient poor she started getting sick yesterday evening. She has been caring for her daughter who has been sick with vomiting illness, fever and cough. Patient reports that she had all those symptoms that her daughter got but has not developed vomiting and although she is nauseated. She reports she has had cough and chest pain with cough. She reports she feels short of breath. She states that her body aches all over. She reports she has nausea but not vomiting. She reports she has headache. Patient reports that she has a sore throat and pain with swallowing. She reports that she has a history of pseudotumor cerebri. She was however well up until these symptoms started. She also reports a history of asthma. She reports she is out of her inhaler. Past Medical History:  Diagnosis Date  . Asthma   . Chlamydia   . Headache(784.0)   . Pseudotumor cerebri   . Trichomonas     Patient Active Problem List   Diagnosis Date Noted  . Active labor 12/20/2011  . Normal delivery 12/20/2011  . Pseudotumor cerebri     Past Surgical History:  Procedure Laterality Date  . LUMBAR PUNCTURE      OB History    Gravida Para Term Preterm AB Living   2 1 1  0 1 1   SAB TAB Ectopic Multiple Live Births   1 0 0 0 1       Home Medications    Prior to Admission medications   Medication Sig Start Date End Date Taking? Authorizing Provider  albuterol (PROVENTIL HFA;VENTOLIN HFA) 108 (90 BASE) MCG/ACT inhaler Inhale 1-2 puffs into the lungs every 6 (six) hours as needed for wheezing. 03/22/15  Yes Garlon HatchetLisa M Sanders, PA-C  Diphenhydramine-PE-APAP (THERAFLU WARMING RELIEF FLU) 12.5-5-325 MG/15ML LIQD Take 15 mLs by mouth every 6 (six) hours as needed. Flu symptoms   Yes Historical Provider,  MD  ibuprofen (ADVIL,MOTRIN) 200 MG tablet Take 800 mg by mouth every 6 (six) hours as needed for headache, mild pain or cramping.   Yes Historical Provider, MD  Multiple Vitamins-Minerals (MULTIVITAMIN WITH MINERALS) tablet Take 1 tablet by mouth daily.   Yes Historical Provider, MD  acetaminophen (TYLENOL) 500 MG tablet Take 2 tablets (1,000 mg total) by mouth every 6 (six) hours as needed. 10/04/16   Arby BarretteMarcy Quitman Norberto, MD  fluconazole (DIFLUCAN) 150 MG tablet Take 1 tablet (150 mg total) by mouth daily. Patient not taking: Reported on 10/04/2016 08/06/16   Judeth HornErin Lawrence, NP  ibuprofen (ADVIL,MOTRIN) 800 MG tablet Take 1 tablet (800 mg total) by mouth 3 (three) times daily. 10/04/16   Arby BarretteMarcy Queenie Aufiero, MD  ondansetron (ZOFRAN ODT) 4 MG disintegrating tablet Take 1 tablet (4 mg total) by mouth every 4 (four) hours as needed for nausea or vomiting. 10/04/16   Arby BarretteMarcy Brevyn Ring, MD  oseltamivir (TAMIFLU) 75 MG capsule Take 1 capsule (75 mg total) by mouth every 12 (twelve) hours. 10/04/16   Arby BarretteMarcy Ardelle Haliburton, MD    Family History Family History  Problem Relation Age of Onset  . Anesthesia problems Neg Hx     Social History Social History  Substance Use Topics  . Smoking status: Never Smoker  . Smokeless tobacco: Never Used  . Alcohol use 0.6  oz/week    1 Glasses of wine per week     Allergies   Shellfish allergy and Doxycycline   Review of Systems Review of Systems 10 Systems reviewed and are negative for acute change except as noted in the HPI.   Physical Exam Updated Vital Signs BP 99/64   Pulse 113   Temp 99.6 F (37.6 C) (Oral)   Resp 17   LMP 09/08/2016 (Within Days)   SpO2 99%   Physical Exam  Constitutional: She is oriented to person, place, and time.  patient appears uncomfortable but is alert and appropriate. No respiratory distress. Nontoxic.  HENT:  Head: Normocephalic and atraumatic.  Right TM has vascular injection. But no purulent efffusion. Left TM slightly dull but  again no effusion. Mild tonsillar erythema but no enlargement or significant exudate. Posterior oropharynx widely patent.  Eyes: Conjunctivae and EOM are normal. Pupils are equal, round, and reactive to light.  Neck: Neck supple.  Cardiovascular: Normal rate, regular rhythm, normal heart sounds and intact distal pulses.   Pulmonary/Chest: Effort normal and breath sounds normal.  Abdominal: Soft. Bowel sounds are normal. She exhibits no distension. There is no tenderness. There is no guarding.  Musculoskeletal: Normal range of motion. She exhibits no edema or tenderness.  Neurological: She is alert and oriented to person, place, and time. No cranial nerve deficit. She exhibits normal muscle tone. Coordination normal.  Skin: Skin is warm and dry. No rash noted.  Psychiatric: She has a normal mood and affect.     ED Treatments / Results  Labs (all labs ordered are listed, but only abnormal results are displayed) Labs Reviewed  COMPREHENSIVE METABOLIC PANEL - Abnormal; Notable for the following:       Result Value   CO2 21 (*)    All other components within normal limits  CBC WITH DIFFERENTIAL/PLATELET - Abnormal; Notable for the following:    WBC 3.8 (*)    Platelets 146 (*)    Lymphs Abs 0.5 (*)    All other components within normal limits  URINALYSIS, ROUTINE W REFLEX MICROSCOPIC - Abnormal; Notable for the following:    Ketones, ur 20 (*)    Leukocytes, UA TRACE (*)    Squamous Epithelial / LPF 0-5 (*)    All other components within normal limits  INFLUENZA PANEL BY PCR (TYPE A & B) - Abnormal; Notable for the following:    Influenza A By PCR POSITIVE (*)    All other components within normal limits  RAPID STREP SCREEN (NOT AT [facility])  CULTURE, GROUP A STREP ([facility])  URINE CULTURE  I-STAT CG4 LACTIC ACID, ED  I-STAT BETA HCG BLOOD, ED ([facility], [facility], [facility] ONLY)    EKG  EKG Interpretation None       Radiology Dg Chest 2 View  Result Date: 10/04/2016 CLINICAL DATA:  Cough EXAM:  CHEST  2 VIEW COMPARISON:  12/13/2015 chest radiograph. FINDINGS: Stable cardiomediastinal silhouette with normal heart size. No pneumothorax. No pleural effusion. Lungs appear clear, with no acute consolidative airspace disease and no pulmonary edema. IMPRESSION: No active cardiopulmonary disease. Electronically Signed   By: Delbert Phenix M.D.   On: 10/04/2016 12:26    Procedures Procedures (including critical care time)  Medications Ordered in ED Medications  0.9 %  sodium chloride infusion (1,000 mLs Intravenous New Bag/Given 10/04/16 1159)    Followed by  0.9 %  sodium chloride infusion (1,000 mLs Intravenous New Bag/Given 10/04/16 1159)  oseltamivir (TAMIFLU) capsule 75 mg (not administered)  ketorolac (TORADOL) 30 MG/ML injection 30 mg (30 mg Intravenous Given 10/04/16 1159)  ondansetron (ZOFRAN) injection 4 mg (4 mg Intravenous Given 10/04/16 1159)     Initial Impression / Assessment and Plan / ED Course  I have reviewed the triage vital signs and the nursing notes.  Pertinent labs & imaging results that were available during my care of the patient were reviewed by me and considered in my medical decision making (see chart for details).      Final Clinical Impressions(s) / ED Diagnoses   Final diagnoses:  Influenza   The patient was feeling very poorly. Symptoms treated. Diagnostic evaluation does show the patient to be flu positive. She also has history of asthma. Patient will be treated with Tamiflu and her albuterol refilled. She is in no respiratory distress and lung fields are clear at this time. Patient's UA is trace positive with white cells present but she does not have GU symptoms. Patient clearly has influenza. I will opt to culture urine for culture positive finding rather than treat empirically. New Prescriptions New Prescriptions   ACETAMINOPHEN (TYLENOL) 500 MG TABLET    Take 2 tablets (1,000 mg total) by mouth every 6 (six) hours as needed.   IBUPROFEN  (ADVIL,MOTRIN) 800 MG TABLET    Take 1 tablet (800 mg total) by mouth 3 (three) times daily.   ONDANSETRON (ZOFRAN ODT) 4 MG DISINTEGRATING TABLET    Take 1 tablet (4 mg total) by mouth every 4 (four) hours as needed for nausea or vomiting.   OSELTAMIVIR (TAMIFLU) 75 MG CAPSULE    Take 1 capsule (75 mg total) by mouth every 12 (twelve) hours.     Arby Barrette, MD 10/04/16 [phone]

## 2016-10-04 NOTE — ED Notes (Signed)
Pt returned from XR and placed back on monitor.

## 2016-10-04 NOTE — ED Triage Notes (Signed)
Pt presents with c/o flu like symptoms. The symptoms began yesterday. She c/o weakness, fatigue, headaches, sweats, sore throat, cough, nausea. She has been taking theraflu and ibuprofen with temporary relief. Her daughter has been sick with same symptoms this week and she has been caring for her.

## 2016-10-04 NOTE — ED Notes (Signed)
Walked patient to the bathroom patient did well 

## 2016-10-05 LAB — URINE CULTURE

## 2016-10-06 LAB — CULTURE, GROUP A STREP (THRC)

## 2018-07-28 NOTE — L&D Delivery Note (Addendum)
OB/GYN Faculty Practice Delivery Note  Ariana Bentley is a 28 y.o. G3P1011 s/p SVD at [redacted]w[redacted]d. She was admitted for SOL.   ROM: 1h 55m with clear/bloody fluid GBS Status: neg   Maximum Maternal Temperature: 99.54F  Labor Progress: . Patient presented to L&D for SOL. Initial SVE: 4/50/-3. Patient received Epidural and AROM performed. She then progressed to complete.   Delivery Date/Time: 8/30 @ 2238 Delivery: Called to room and patient was complete and pushing. Head delivered in ROP position. No nuchal cord present. Shoulder and body delivered in usual fashion. Infant with spontaneous cry, placed on mother's abdomen, dried and stimulated. Cord clamped x 2 after 1-minute delay, and cut by family member. Cord blood drawn. Placenta delivered spontaneously with gentle cord traction. Fundus firm with massage and Pitocin. Labia, perineum, vagina, and cervix inspected inspected with first degree perineal laceration. Repair attempted but patient refused and refused lidocaine as well in attempt to make repair more comfortable. Discussed risks of not repair and patient continued to refuse.  Baby Weight: pending  Placenta: Sent to L&D Complications: None Lacerations: First degree perineal laceration  EBL: 300 mL Analgesia: Epidural    Postpartum Planning [ ]  message to sent to schedule follow-up  [ ]  vaccines UTD  Infant: APGAR (1 MIN): 9   APGAR (5 MINS): 9   APGAR (10 MINS):     Barrington Ellison, MD OB Family Medicine Fellow, [facility], [facility] 03/27/2019, 11:24 PM

## 2018-08-04 ENCOUNTER — Encounter (HOSPITAL_COMMUNITY): Payer: Self-pay | Admitting: *Deleted

## 2018-08-04 ENCOUNTER — Inpatient Hospital Stay (HOSPITAL_COMMUNITY): Payer: Medicaid Other

## 2018-08-04 ENCOUNTER — Inpatient Hospital Stay (HOSPITAL_COMMUNITY)
Admission: AD | Admit: 2018-08-04 | Discharge: 2018-08-04 | Disposition: A | Discharge status: Home / Self Care | Payer: Medicaid Other | Attending: Family Medicine | Admitting: Family Medicine

## 2018-08-04 ENCOUNTER — Other Ambulatory Visit: Payer: Self-pay

## 2018-08-04 DIAGNOSIS — O26891 Other specified pregnancy related conditions, first trimester: Secondary | ICD-10-CM

## 2018-08-04 DIAGNOSIS — R600 Localized edema: Secondary | ICD-10-CM | POA: Insufficient documentation

## 2018-08-04 DIAGNOSIS — O210 Mild hyperemesis gravidarum: Secondary | ICD-10-CM | POA: Diagnosis not present

## 2018-08-04 DIAGNOSIS — R350 Frequency of micturition: Secondary | ICD-10-CM | POA: Diagnosis present

## 2018-08-04 DIAGNOSIS — O3680X0 Pregnancy with inconclusive fetal viability, not applicable or unspecified: Secondary | ICD-10-CM | POA: Diagnosis not present

## 2018-08-04 DIAGNOSIS — Z590 Homelessness: Secondary | ICD-10-CM | POA: Insufficient documentation

## 2018-08-04 DIAGNOSIS — Z3A01 Less than 8 weeks gestation of pregnancy: Secondary | ICD-10-CM | POA: Insufficient documentation

## 2018-08-04 DIAGNOSIS — O219 Vomiting of pregnancy, unspecified: Secondary | ICD-10-CM | POA: Diagnosis not present

## 2018-08-04 DIAGNOSIS — R109 Unspecified abdominal pain: Secondary | ICD-10-CM

## 2018-08-04 DIAGNOSIS — Z3491 Encounter for supervision of normal pregnancy, unspecified, first trimester: Secondary | ICD-10-CM

## 2018-08-04 HISTORY — DX: Unspecified infectious disease: B99.9

## 2018-08-04 HISTORY — DX: Anxiety disorder, unspecified: F41.9

## 2018-08-04 HISTORY — DX: Major depressive disorder, single episode, unspecified: F32.9

## 2018-08-04 HISTORY — DX: Depression, unspecified: F32.A

## 2018-08-04 LAB — RAPID URINE DRUG SCREEN, HOSP PERFORMED
Amphetamines: NOT DETECTED
Barbiturates: NOT DETECTED
Benzodiazepines: NOT DETECTED
Cocaine: NOT DETECTED
Opiates: NOT DETECTED
Tetrahydrocannabinol: POSITIVE — AB

## 2018-08-04 LAB — URINALYSIS, ROUTINE W REFLEX MICROSCOPIC
Bilirubin Urine: NEGATIVE
Glucose, UA: NEGATIVE mg/dL
Hgb urine dipstick: NEGATIVE
Ketones, ur: NEGATIVE mg/dL
Nitrite: NEGATIVE
Protein, ur: NEGATIVE mg/dL
Specific Gravity, Urine: 1.023 (ref 1.005–1.030)
WBC, UA: >50 WBC/hpf — ABNORMAL HIGH (ref 0–5)
pH: 6 (ref 5.0–8.0)

## 2018-08-04 LAB — WET PREP, GENITAL
Clue Cells Wet Prep HPF POC: NONE SEEN
Sperm: NONE SEEN
Trich, Wet Prep: NONE SEEN
Yeast Wet Prep HPF POC: NONE SEEN

## 2018-08-04 LAB — CBC
HCT: 38.2 % (ref 36.0–46.0)
Hemoglobin: 12.9 g/dL (ref 12.0–15.0)
MCH: 27.9 pg (ref 26.0–34.0)
MCHC: 33.8 g/dL (ref 30.0–36.0)
MCV: 82.5 fL (ref 80.0–100.0)
NRBC: 0 % (ref 0.0–0.2)
Platelets: 203 10*3/uL (ref 150–400)
RBC: 4.63 MIL/uL (ref 3.87–5.11)
RDW: 13.9 % (ref 11.5–15.5)
WBC: 6.1 10*3/uL (ref 4.0–10.5)

## 2018-08-04 LAB — HCG, QUANTITATIVE, PREGNANCY: HCG, BETA CHAIN, QUANT, S: 40549 m[IU]/mL — AB (ref <5)

## 2018-08-04 LAB — POCT PREGNANCY, URINE: Preg Test, Ur: POSITIVE — AB

## 2018-08-04 NOTE — MAU Note (Signed)
Has no period. Has not done test.. has been giving plasma, doesn't  Know if that caused it. Has been nauseous, been a little tired, has had increased urination- no pain or burning.

## 2018-08-04 NOTE — MAU Provider Note (Signed)
Chief Complaint: Urinary Frequency and Possible Pregnancy   First Provider Initiated Contact with Patient 08/04/18 1215      SUBJECTIVE HPI: Ariana Bentley is a 28 y.o. G3P1011 at [id number] LMP who presents to maternity admissions reporting nausea and urinary frequency. She initially did not report pain but does report some sharp pain with sneezing today.  There are no other symptoms. She is not vomiting. She has not tried any treatments.  She had a Nexplanon but it was removed when she was diagnosed with pseudotumor cerebri    HPI  Past Medical History:  Diagnosis Date  . Anxiety   . Asthma   . Chlamydia   . Depression    doing ok  . Headache(784.0)   . Infection    UTI  . Pseudotumor cerebri   . Trichomonas    Past Surgical History:  Procedure Laterality Date  . LUMBAR PUNCTURE    . NO PAST SURGERIES    . WISDOM TOOTH EXTRACTION     Social History   Socioeconomic History  . Marital status: Significant Other    Spouse name: Not on file  . Number of children: Not on file  . Years of education: Not on file  . Highest education level: Not on file  Occupational History  . Not on file  Social Needs  . Financial resource strain: Not on file  . Food insecurity:    Worry: Not on file    Inability: Not on file  . Transportation needs:    Medical: Not on file    Non-medical: Not on file  Tobacco Use  . Smoking status: Never Smoker  . Smokeless tobacco: Never Used  Substance and Sexual Activity  . Alcohol use: Yes    Alcohol/week: 1.0 standard drinks    Types: 1 Glasses of wine per week    Comment: rare  . Drug use: Not Currently    Types: Marijuana    Comment: Nov  . Sexual activity: Yes    Birth control/protection: None  Lifestyle  . Physical activity:    Days per week: Not on file    Minutes per session: Not on file  . Stress: Not on file  Relationships  . Social connections:    Talks on phone: Not on file    Gets together: Not on file    Attends religious  service: Not on file    Active member of club or organization: Not on file    Attends meetings of clubs or organizations: Not on file    Relationship status: Not on file  . Intimate partner violence:    Fear of current or ex partner: Not on file    Emotionally abused: Not on file    Physically abused: Not on file    Forced sexual activity: Not on file  Other Topics Concern  . Not on file  Social History Narrative  . Not on file   No current facility-administered medications on file prior to encounter.    Current Outpatient Medications on File Prior to Encounter  Medication Sig Dispense Refill  . acetaminophen (TYLENOL) 500 MG tablet Take 2 tablets (1,000 mg total) by mouth every 6 (six) hours as needed. 30 tablet 0  . albuterol (PROVENTIL HFA;VENTOLIN HFA) 108 (90 BASE) MCG/ACT inhaler Inhale 1-2 puffs into the lungs every 6 (six) hours as needed for wheezing. 1 Inhaler 0  . albuterol (PROVENTIL HFA;VENTOLIN HFA) 108 (90 Base) MCG/ACT inhaler Inhale 1-2 puffs into the lungs every 6 (  six) hours as needed for wheezing or shortness of breath. 1 Inhaler 0  . Diphenhydramine-PE-APAP (THERAFLU WARMING RELIEF FLU) 12.5-5-325 MG/15ML LIQD Take 15 mLs by mouth every 6 (six) hours as needed. Flu symptoms    . fluconazole (DIFLUCAN) 150 MG tablet Take 1 tablet (150 mg total) by mouth daily. (Patient not taking: Reported on 10/04/2016) 1 tablet 1  . ibuprofen (ADVIL,MOTRIN) 200 MG tablet Take 800 mg by mouth every 6 (six) hours as needed for headache, mild pain or cramping.    Marland Kitchen ibuprofen (ADVIL,MOTRIN) 800 MG tablet Take 1 tablet (800 mg total) by mouth 3 (three) times daily. 21 tablet 0  . Multiple Vitamins-Minerals (MULTIVITAMIN WITH MINERALS) tablet Take 1 tablet by mouth daily.    . ondansetron (ZOFRAN ODT) 4 MG disintegrating tablet Take 1 tablet (4 mg total) by mouth every 4 (four) hours as needed for nausea or vomiting. 20 tablet 0  . oseltamivir (TAMIFLU) 75 MG capsule Take 1 capsule (75 mg  total) by mouth every 12 (twelve) hours. 10 capsule 0  . [DISCONTINUED] levocetirizine (XYZAL) 5 MG tablet Take 1 tablet (5 mg total) by mouth every evening. 30 tablet 3   Allergies  Allergen Reactions  . Shellfish Allergy Anaphylaxis  . Doxycycline Other (See Comments)    Causes swelling behind the eyes; aggravates PTC condition    ROS:  Review of Systems  Constitutional: Negative for chills, fatigue and fever.  Respiratory: Negative for shortness of breath.   Cardiovascular: Negative for chest pain.  Gastrointestinal: Positive for nausea. Negative for vomiting.  Genitourinary: Positive for frequency and pelvic pain. Negative for difficulty urinating, dysuria, flank pain, vaginal bleeding, vaginal discharge and vaginal pain.  Neurological: Negative for dizziness and headaches.  Psychiatric/Behavioral: Negative.      I have reviewed patient's Past Medical Hx, Surgical Hx, Family Hx, Social Hx, medications and allergies.   Physical Exam   Patient Vitals for the past 24 hrs:  BP Temp Temp src Pulse Resp Weight  08/04/18 0935 116/72 98.1 F (36.7 C) Oral 88 16 121.6 kg   Constitutional: Well-developed, well-nourished female in no acute distress.  Cardiovascular: normal rate Respiratory: normal effort GI: Abd soft, non-tender. Pos BS x 4 MS: Extremities nontender, no edema, normal ROM Neurologic: Alert and oriented x 4.  GU: Neg CVAT.  PELVIC EXAM: Cervix pink, visually closed, without lesion, scant white creamy discharge, vaginal walls and external genitalia normal Bimanual exam: Cervix 0/long/high, firm, anterior, neg CMT, uterus nontender, nonenlarged, mild adnexal tenderness on the right, none on the left, no enlargement or mass bilaterally   LAB RESULTS Results for orders placed or performed during the hospital encounter of 08/04/18 (from the past 24 hour(s))  Urinalysis, Routine w reflex microscopic     Status: Abnormal   Collection Time: 08/04/18 10:02 AM  Result  Value Ref Range   Color, Urine YELLOW YELLOW   APPearance HAZY (A) CLEAR   Specific Gravity, Urine 1.023 1.005 - 1.030   pH 6.0 5.0 - 8.0   Glucose, UA NEGATIVE NEGATIVE mg/dL   Hgb urine dipstick NEGATIVE NEGATIVE   Bilirubin Urine NEGATIVE NEGATIVE   Ketones, ur NEGATIVE NEGATIVE mg/dL   Protein, ur NEGATIVE NEGATIVE mg/dL   Nitrite NEGATIVE NEGATIVE   Leukocytes, UA LARGE (A) NEGATIVE   RBC / HPF 0-5 0 - 5 RBC/hpf   WBC, UA >50 (H) 0 - 5 WBC/hpf   Bacteria, UA FEW (A) NONE SEEN   Squamous Epithelial / LPF 6-10 0 - 5  Mucus PRESENT   Urine rapid drug screen (hosp performed)     Status: Abnormal   Collection Time: 08/04/18 10:03 AM  Result Value Ref Range   Opiates NONE DETECTED NONE DETECTED   Cocaine NONE DETECTED NONE DETECTED   Benzodiazepines NONE DETECTED NONE DETECTED   Amphetamines NONE DETECTED NONE DETECTED   Tetrahydrocannabinol POSITIVE (A) NONE DETECTED   Barbiturates NONE DETECTED NONE DETECTED  Pregnancy, urine POC     Status: Abnormal   Collection Time: 08/04/18 10:05 AM  Result Value Ref Range   Preg Test, Ur POSITIVE (A) NEGATIVE  Wet prep, genital     Status: Abnormal   Collection Time: 08/04/18 12:19 PM  Result Value Ref Range   Yeast Wet Prep HPF POC NONE SEEN NONE SEEN   Trich, Wet Prep NONE SEEN NONE SEEN   Clue Cells Wet Prep HPF POC NONE SEEN NONE SEEN   WBC, Wet Prep HPF POC MANY (A) NONE SEEN   Sperm NONE SEEN   CBC     Status: None   Collection Time: 08/04/18 12:46 PM  Result Value Ref Range   WBC 6.1 4.0 - 10.5 K/uL   RBC 4.63 3.87 - 5.11 MIL/uL   Hemoglobin 12.9 12.0 - 15.0 g/dL   HCT 38.2 36.0 - 46.0 %   MCV 82.5 80.0 - 100.0 fL   MCH 27.9 26.0 - 34.0 pg   MCHC 33.8 30.0 - 36.0 g/dL   RDW 13.9 11.5 - 15.5 %   Platelets 203 150 - 400 K/uL   nRBC 0.0 0.0 - 0.2 %  hCG, quantitative, pregnancy     Status: Abnormal   Collection Time: 08/04/18 12:46 PM  Result Value Ref Range   hCG, Beta Chain, Quant, S 40,549 (H) <5 mIU/mL        IMAGING US Ob Less Than 14 Weeks With Ob Transvaginal  Result Date: 08/04/2018 CLINICAL DATA:  Abdominal pain during pregnancy in first trimester. EXAM: OBSTETRIC <14 WK [place] AND TRANSVAGINAL OB US TECHNIQUE: Both transabdominal and transvaginal ultrasound examinations were performed for complete evaluation of the gestation as well as the maternal uterus, adnexal regions, and pelvic cul-de-sac. Transvaginal technique was performed to assess early pregnancy. COMPARISON:  None. FINDINGS: Intrauterine gestational sac: Single visualized. Yolk sac:  Visualized. Embryo:  Visualized. Cardiac Activity: Visualized. Heart Rate: 68 bpm CRL:  2.3 mm   5 w   5 d                  [place] EDC: April 01, 2019. Subchorionic hemorrhage:  None visualized. Maternal uterus/adnexae: Right ovary appears normal. Probable corpus luteum cyst seen in left ovary. No free fluid is noted. IMPRESSION: Single live intrauterine gestation of 5 weeks 5 days. Electronically Signed   By: Marijo Conception, M.D.   On: 08/04/2018 13:19    MAU Management/MDM: Orders Placed This Encounter  Procedures  . Wet prep, genital  . Culture, OB Urine  . US OB LESS THAN 14 WEEKS WITH OB TRANSVAGINAL  . US OB Transvaginal  . Urinalysis, Routine w reflex microscopic  . Urine rapid drug screen (hosp performed)  . CBC  . hCG, quantitative, pregnancy  . Consult to social work  . Pregnancy, urine POC  . Discharge patient    No orders of the defined types were placed in this encounter.   Wet prep, UA without evidence of infection. Urine sent for culture.  Ultrasound shows IUP but low FHR, c/w very early pregnancy.  Outpatient viability  [place] ordered and message to establish care with [facility] for high risk pregnancy due to psuedotumor cerebri.  Return to MAU with emergencies.  List of safe OTC medications given to manage mild nuasea at this time. Pt discharged with strict return precautions.  ASSESSMENT 1. Normal IUP (intrauterine pregnancy) on prenatal  ultrasound, first trimester   2. Abdominal pain during pregnancy in first trimester   3. Nausea and vomiting during pregnancy prior to [redacted] weeks gestation   4. Pregnancy with inconclusive fetal viability, single or unspecified fetus     PLAN Discharge home Allergies as of 08/04/2018      Reactions   Shellfish Allergy Anaphylaxis   Doxycycline Other (See Comments)   Causes swelling behind the eyes; aggravates PTC condition      Medication List    STOP taking these medications   fluconazole 150 MG tablet Commonly known as:  DIFLUCAN   ibuprofen 200 MG tablet Commonly known as:  ADVIL,MOTRIN   ibuprofen 800 MG tablet Commonly known as:  ADVIL,MOTRIN   multivitamin with minerals tablet   ondansetron 4 MG disintegrating tablet Commonly known as:  ZOFRAN ODT   oseltamivir 75 MG capsule Commonly known as:  TAMIFLU   THERAFLU WARMING RELIEF FLU 12.5-5-325 MG/15ML Liqd Generic drug:  diphenhydrAMINE-PE-APAP     TAKE these medications   acetaminophen 500 MG tablet Commonly known as:  TYLENOL Take 2 tablets (1,000 mg total) by mouth every 6 (six) hours as needed.   albuterol 108 (90 Base) MCG/ACT inhaler Commonly known as:  PROVENTIL HFA;VENTOLIN HFA Inhale 1-2 puffs into the lungs every 6 (six) hours as needed for wheezing.   albuterol 108 (90 Base) MCG/ACT inhaler Commonly known as:  PROVENTIL HFA;VENTOLIN HFA Inhale 1-2 puffs into the lungs every 6 (six) hours as needed for wheezing or shortness of breath.      Follow-up [place] [facility] Follow up.   Specialty:  Radiology Why:  Ultrasound will call you to schedule your imaging in 10 days to 2 weeks. Contact information: [address] [id number] mc [place] [place] for [place] Follow up.   Specialty:  Obstetrics and Gynecology Why:  The office will call you with appointment. Return to MAU for  emergencies. Contact information: Iona Dasher [place] Certified Nurse-Midwife 08/04/2018  1:45 PM

## 2018-08-04 NOTE — Progress Notes (Signed)
CSW met with patient at bedside regarding housing concerns. Patient presented pleasant and was engaged with CSW. Patient reported that she recently transitioned back to [place] from [place] and does not have stable housing. Patient reported that she moved back to [facility] to have more support with her daughter. Patient reported that her daughter is now residing with the paternal grandmother until she is able to get stable housing again. Patient reported that she is currently enrolled at [employer] and plans to finish in July. Patient reported that she was working part time and is not currently employed. CSW asked patient if she had income, patient reported not really. Patient reported that she is currently staying in a hotel for 2 days and that her friend gave her money to get the room. Patient reported that hotels are expensive and she wont be able to afford one. Patient reported that she had been staying in her car and that it has been cold lately. CSW informed patient about local shelters. Patient reported that she refuses to stay at a shelter because they are dirty. CSW asked patient if she has any family that she can stay with, patient reported no. Patient reported that she does not get along with her brother who is local and that is who her mother stays with. Patient reported that she is trying to obtain student housing, but she has to verify her income. Patient reported that she plans to receive her tax refund and refund check for school next month. Patient reported that in the mean time she doesn't have a stable place to stay. Patient and CSW contacted [facility] and patient signed up to be placed on the wait list. CSW asked patient if she wanted shelter resources in case she changed her mind, patient declined and reported  "it doesn't sit right with my spirit" and reiterated that they are dirty. CSW encouraged patient to reach out to supports until she is able to obtain housing on her  own. CSW and patient discussed [employer] and CSW encouraged patient to apply. Patient reported that she would apply and requested that [place] contact her if she learns about any income based housing that is not connected with [employer]. CSW verified patient's contact information and agreed to contact her with any updated resources. CSW asked patient if she needed any other resources, patient reported none, noting she receives food stamps and medicaid.   No barriers to discharge at this time. Patient declined shelter resources.  Abundio Miu, [place] Worker [facility] Cell#: [phone]

## 2018-08-04 NOTE — MAU Provider Note (Signed)
History    Chief Complaint  Patient presents with  . Urinary Frequency  . Possible Pregnancy   Ariana Bentley is a G3P1011 at 13w6dby LMP who presents for a missed period, increased urinary frequency/urgency, and nausea. Her LMP was December 5th or 6th and she had not had any bleeding/discharge since then. She is not currently using birth control but has not been planning/desiring a pregnancy. She has been experiencing nausea in the morning but no vomiting and is eating/drinking well. She has increased urinary frequency and urgency but no pain with urination or vaginal discharge. She is more fatigued that usual. She has very mild lower abdominal discomfort when she sneezes but otherwise denies any abdominal pain or cramping. She has had BLE edema for months and [facility] diagnosed with venous insufficiency and was prescribed dyazide for periodic use. She is not currently taking this medication. She denies taking any other medications. She would like gonorrhea/chlamydia testing but has not had any vaginal discharge/odor/discomfort.   She has been homeless for [place] hotels. She has a 654year old daughter who she frequently sees but she is staying with her paternal grandmother. She has been donating plasma. She is not connected with any local resources for housing.    OB History    Gravida  3   Para  1   Term  1   Preterm  0   AB  1   Living  1     SAB  1   TAB  0   Ectopic  0   Multiple  0   Live Births  1           Past Medical History:  Diagnosis Date  . Asthma   . Chlamydia   . Headache(784.0)   . Pseudotumor cerebri   . Trichomonas     Past Surgical History:  Procedure Laterality Date  . LUMBAR PUNCTURE      Family History  Problem Relation Age of Onset  . Anesthesia problems Neg Hx     Social History   Tobacco Use  . Smoking status: Never Smoker  .  Smokeless tobacco: Never Used  Substance Use Topics  . Alcohol use: Yes    Alcohol/week: 1.0 standard drinks    Types: 1 Glasses of wine per week  . Drug use: No    Types: Marijuana    Comment: denies recent use    Allergies:  Allergies  Allergen Reactions  . Shellfish Allergy Anaphylaxis  . Doxycycline Other (See Comments)    Causes swelling behind the eyes; aggravates PTC condition    Medications Prior to Admission  Medication Sig Dispense Refill Last Dose  . acetaminophen (TYLENOL) 500 MG tablet Take 2 tablets (1,000 mg total) by mouth every 6 (six) hours as needed. 30 tablet 0   . albuterol (PROVENTIL HFA;VENTOLIN HFA) 108 (90 BASE) MCG/ACT inhaler Inhale 1-2 puffs into the lungs every 6 (six) hours as needed for wheezing. 1 Inhaler 0 Past Week at Unknown time  . albuterol (PROVENTIL HFA;VENTOLIN HFA) 108 (90 Base) MCG/ACT inhaler Inhale 1-2 puffs into the lungs every 6 (six) hours as needed for wheezing or shortness of breath. 1 Inhaler 0   . Diphenhydramine-PE-APAP (THERAFLU WARMING RELIEF FLU) 12.5-5-325 MG/15ML LIQD Take 15 mLs by mouth every 6 (six) hours as needed. Flu symptoms   10/03/2016 at Unknown time  .  fluconazole (DIFLUCAN) 150 MG tablet Take 1 tablet (150 mg total) by mouth daily. (Patient not taking: Reported on 10/04/2016) 1 tablet 1 Not Taking at Unknown time  . ibuprofen (ADVIL,MOTRIN) 200 MG tablet Take 800 mg by mouth every 6 (six) hours as needed for headache, mild pain or cramping.   10/03/2016 at Unknown time  . ibuprofen (ADVIL,MOTRIN) 800 MG tablet Take 1 tablet (800 mg total) by mouth 3 (three) times daily. 21 tablet 0   . Multiple Vitamins-Minerals (MULTIVITAMIN WITH MINERALS) tablet Take 1 tablet by mouth daily.   Past Week at Unknown time  . ondansetron (ZOFRAN ODT) 4 MG disintegrating tablet Take 1 tablet (4 mg total) by mouth every 4 (four) hours as needed for nausea or vomiting. 20 tablet 0   . oseltamivir (TAMIFLU) 75 MG capsule Take 1 capsule (75 mg  total) by mouth every 12 (twelve) hours. 10 capsule 0     Review of Systems  Constitutional: Negative.   HENT: Negative.   Eyes: Negative.   Respiratory: Negative.   Cardiovascular: Positive for leg swelling.  Gastrointestinal: Positive for nausea. Negative for abdominal pain and vomiting.  Genitourinary: Positive for frequency and urgency. Negative for dysuria and hematuria.  Neurological: Negative.    Physical Exam There were no vitals taken for this visit. Physical Exam  Constitutional: She is oriented to person, place, and time. She appears well-nourished.  Eyes: No scleral icterus.  Cardiovascular: Normal rate, regular rhythm, normal heart sounds and intact distal pulses.  Respiratory: Effort normal and breath sounds normal. She has no wheezes. She has no rales.  GI: There is no abdominal tenderness. There is no guarding.  Neurological: She is oriented to person, place, and time.  Skin: Skin is warm and dry.  Psychiatric: She has a normal mood and affect. Her behavior is normal.  Female genitalia: not done Vagina: normal appearing vagina with normal color and discharge, no lesions Cervix: cervical discharge present - creamy, milky and scant and cervical motion tenderness absent Adnexa: no masses and tenderness right  MAU Course Procedures  MDM - Urinary pregnancy test positive - UA: Neg nitrite, few bacteria, >50 WBC's. Culture pending. - STI pending - Urine Toxicology: Positive THC, otherwise neg - Ectopic eval: CBC unremarkable, bHCG pending, US showed "Single live intrauterine gestation of 5 weeks 5 days."  Assessment and Plan: - Pregnancy: Dicussed results of the positive pregnancy test with the pt. She understands and expressed concern about her living situation. SW consulted and provided her with potential housing resources. [place] scheduled. Plan to follow-up at initial OB appointment.  - Nausea: Discussed OTC Unisom for mild nausea

## 2018-08-05 ENCOUNTER — Telehealth: Payer: Self-pay | Admitting: Family Medicine

## 2018-08-05 LAB — GC/CHLAMYDIA PROBE AMP (~~LOC~~) NOT AT ARMC
CHLAMYDIA, DNA PROBE: NEGATIVE
NEISSERIA GONORRHEA: NEGATIVE

## 2018-08-05 NOTE — Telephone Encounter (Signed)
Called patient and informed of upcoming appointment. Stated she would attend.

## 2018-08-06 LAB — CULTURE, OB URINE: Culture: 80000 — AB

## 2018-08-07 ENCOUNTER — Telehealth: Payer: Self-pay | Admitting: Student

## 2018-08-07 DIAGNOSIS — O2341 Unspecified infection of urinary tract in pregnancy, first trimester: Secondary | ICD-10-CM

## 2018-08-07 MED ORDER — CEPHALEXIN 500 MG PO CAPS: 500.0000 mg | ORAL_CAPSULE | Freq: Four times a day (QID) | ORAL | 0 refills | Status: DC

## 2018-08-07 NOTE — Telephone Encounter (Signed)
Verified by name & DOB. Notified of positive urine culture. Verified allergies and pharmacy. Discussed reasons to return to MAU.   Judeth Horn, NP

## 2018-08-19 ENCOUNTER — Ambulatory Visit (INDEPENDENT_AMBULATORY_CARE_PROVIDER_SITE_OTHER): Payer: Medicaid Other | Admitting: General Practice

## 2018-08-19 ENCOUNTER — Ambulatory Visit (HOSPITAL_COMMUNITY)
Admission: RE | Admit: 2018-08-19 | Discharge: 2018-08-19 | Disposition: A | Discharge status: Home / Self Care | Payer: Medicaid Other | Source: Ambulatory Visit | Attending: Advanced Practice Midwife | Admitting: Advanced Practice Midwife

## 2018-08-19 DIAGNOSIS — O3680X0 Pregnancy with inconclusive fetal viability, not applicable or unspecified: Secondary | ICD-10-CM | POA: Diagnosis present

## 2018-08-19 DIAGNOSIS — R109 Unspecified abdominal pain: Secondary | ICD-10-CM | POA: Insufficient documentation

## 2018-08-19 DIAGNOSIS — Z712 Person consulting for explanation of examination or test findings: Secondary | ICD-10-CM

## 2018-08-19 DIAGNOSIS — O26891 Other specified pregnancy related conditions, first trimester: Secondary | ICD-10-CM | POA: Diagnosis present

## 2018-08-19 NOTE — Progress Notes (Signed)
Patient presents to office today for viability ultrasound results. Reviewed results with Dr Vergie Living who finds living IUP, patient should begin prenatal care.   Informed patient of results, reviewed dating, & provided pictures. Patient has new OB appt scheduled 2/12.  Chase Caller RN BSN 08/19/18

## 2018-08-22 NOTE — Progress Notes (Signed)
I have reviewed the chart and agree with nursing staff's documentation of this patient's encounter.  Ajahni Nay, MD 08/22/2018 11:16 PM    

## 2018-09-08 ENCOUNTER — Other Ambulatory Visit (HOSPITAL_COMMUNITY)
Admission: RE | Admit: 2018-09-08 | Discharge: 2018-09-08 | Disposition: A | Discharge status: Home / Self Care | Payer: Medicaid Other | Source: Ambulatory Visit | Attending: Obstetrics & Gynecology | Admitting: Obstetrics & Gynecology

## 2018-09-08 ENCOUNTER — Ambulatory Visit: Payer: Self-pay | Admitting: Clinical

## 2018-09-08 ENCOUNTER — Ambulatory Visit (INDEPENDENT_AMBULATORY_CARE_PROVIDER_SITE_OTHER): Payer: Medicaid Other | Admitting: Obstetrics & Gynecology

## 2018-09-08 ENCOUNTER — Encounter: Payer: Self-pay | Admitting: Obstetrics & Gynecology

## 2018-09-08 VITALS — BP 119/63 | HR 82 | Wt 271.0 lb

## 2018-09-08 DIAGNOSIS — O0990 Supervision of high risk pregnancy, unspecified, unspecified trimester: Secondary | ICD-10-CM

## 2018-09-08 DIAGNOSIS — Z8632 Personal history of gestational diabetes: Secondary | ICD-10-CM

## 2018-09-08 DIAGNOSIS — Z3490 Encounter for supervision of normal pregnancy, unspecified, unspecified trimester: Secondary | ICD-10-CM | POA: Insufficient documentation

## 2018-09-08 DIAGNOSIS — O99210 Obesity complicating pregnancy, unspecified trimester: Secondary | ICD-10-CM

## 2018-09-08 DIAGNOSIS — Z658 Other specified problems related to psychosocial circumstances: Secondary | ICD-10-CM

## 2018-09-08 DIAGNOSIS — Z3491 Encounter for supervision of normal pregnancy, unspecified, first trimester: Secondary | ICD-10-CM

## 2018-09-08 LAB — HEMOGLOBIN A1C
Est. average glucose Bld gHb Est-mCnc: 105 mg/dL
Hgb A1c MFr Bld: 5.3 % (ref 4.8–5.6)

## 2018-09-08 NOTE — BH Specialist Note (Signed)
Integrated Behavioral Health Initial Visit  MRN: [id number] Name: Ariana Bentley  Number of Integrated Behavioral Health Clinician visits:: 1/6 Session Start time: 10:56 Session End time: 11:11 Total time: 15 minutes  Type of Service: Integrated Behavioral Health- Individual/Family Interpretor:No. Interpretor Name and Language: n/a   Warm Hand Off Completed.       SUBJECTIVE: Ariana Bentley is a 28 y.o. female accompanied by n/a Patient was referred by Nicholaus Bloom, MD for Initial OB introduction to integrated behavioral health services . Patient reports the following symptoms/concerns: Pt states her primary concern today is homelessness. Pt is in communication with [facility]; currently staying with different friends each night. Pt does not feel supported by family.  Duration of problem: Ongoing; Severity of problem: moderate  OBJECTIVE: Mood: Depressed and Affect: Depressed and Tearful Risk of harm to self or others: No plan to harm self or others  LIFE CONTEXT: Family and Social: Pt is experiencing homelessness. Pt's daughter is living temporarily with her grandmother, until pt is in stable housing.  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 Reduce symptoms of: stress 2. Increase knowledge and/or ability of: stress reduction  3. Demonstrate ability to: Increase healthy adjustment to current life circumstances and Increase adequate support systems for patient/family  INTERVENTIONS: Interventions utilized: Supportive Counseling, Psychoeducation and/or Health Education and Link to [facility]  Standardized Assessments completed: GAD-7 and PHQ 9  ASSESSMENT: Patient currently experiencing Supervision of high risk pregnancy and Psychosocial stress.   Patient may benefit from Initial OB introduction to  integrated behavioral health services And link to community resources.  PLAN: 1. Follow up with behavioral health clinician on : One month (next medical visit) 2. Behavioral recommendations:  -Continue attempts to become resident at [facility] at the [facility] -Apply for [facility] Department asap -Consider applying for [facility], though [employer], as backup plan  -Consider establishing as a Dentist (as walk-in), for additional backup services, as needed  (shower, laundry, mailbox, etc) -Consider obtaining [place] reduced[place]fare bus ID, as backup transportation  3. Referral(s): Integrated Art gallery manager (In Clinic) and [facility]:  Arts administrator, Housing and Transportation 4. "From scale of 1-10, how likely are you to follow plan?": -  Rae Lips, LCSW  Depression screen [facility] 2/9 09/08/2018  Decreased Interest 2  Down, Depressed, Hopeless 2  PHQ - 2 Score 4  Altered sleeping 2  Tired, decreased energy 2  Change in appetite 1  Feeling bad or failure about yourself  2  Trouble concentrating 0  Moving slowly or fidgety/restless 1  Suicidal thoughts 0  PHQ-9 Score 12   GAD 7 : Generalized Anxiety Score 09/08/2018  Nervous, Anxious, on Edge 2  Control/stop worrying 3  Worry too much - different things 2  Trouble relaxing 2  Restless 1  Easily annoyed or irritable 2  Afraid - awful might happen 1  Total GAD 7 Score 13

## 2018-09-08 NOTE — Progress Notes (Signed)
Informal bedside US done to confirm viability. FHR - 166 bpm and fetal movement observed. Pt also viewed [place] monitor and witnessed fetal status.

## 2018-09-08 NOTE — Progress Notes (Signed)
  Subjective:    Ariana Bentley is being seen today for her first obstetrical visit.  This is not a planned pregnancy. She is at [redacted]w[redacted]d gestation. Her obstetrical history is significant for obesity and h/o GDM She has a h/o pseudotumor cerebri. Relationship with FOB: no relationship. Patient does intend to breast feed. Pregnancy history fully reviewed.  Patient reports no complaints.  Review of Systems:   Review of Systems  Objective:     LMP 07/01/2018  Physical Exam  Exam  She seems somewhat discombobulated, ? Under the influence  Breathing, conversing, and ambulating normally Well nourished, well hydrated Black female, no apparent distress Abd- benign, obese Heart- rrr Lungs- CTAB FHR- nomral Cervix- vaginal discharge frothy    Assessment:    Pregnancy: G3P1011 Patient Active Problem List   Diagnosis Date Noted  . Bilateral lower extremity edema 08/04/2018  . Active labor 12/20/2011  . Normal delivery 12/20/2011  . Pseudotumor cerebri        Plan:     Initial labs drawn. Prenatal vitamins. Problem list reviewed and updated. AFP3 discussed: declined. Role of ultrasound in pregnancy discussed; fetal survey: ordered  She requests NIPS. Check UDS, HBA1C Pap sent She declines nutrition consult Rec'd about 15 pound weight gain   Allie Bossier 09/08/2018

## 2018-09-09 LAB — CYTOLOGY - PAP
BACTERIAL VAGINITIS: POSITIVE — AB
Candida vaginitis: NEGATIVE
Chlamydia: NEGATIVE
Diagnosis: NEGATIVE
Neisseria Gonorrhea: NEGATIVE
Trichomonas: NEGATIVE

## 2018-09-10 ENCOUNTER — Other Ambulatory Visit: Payer: Self-pay | Admitting: Obstetrics & Gynecology

## 2018-09-10 MED ORDER — METRONIDAZOLE 500 MG PO TABS: 500.0000 mg | ORAL_TABLET | Freq: Two times a day (BID) | ORAL | 0 refills | Status: DC

## 2018-09-10 NOTE — Progress Notes (Signed)
Flagyl prescribed for BV 

## 2018-09-12 LAB — URINE CULTURE, OB REFLEX

## 2018-09-12 LAB — CULTURE, OB URINE

## 2018-09-13 ENCOUNTER — Telehealth: Payer: Self-pay

## 2018-09-13 ENCOUNTER — Telehealth: Payer: Self-pay | Admitting: Obstetrics & Gynecology

## 2018-09-13 ENCOUNTER — Other Ambulatory Visit: Payer: Self-pay | Admitting: Obstetrics & Gynecology

## 2018-09-13 NOTE — Telephone Encounter (Signed)
Called pt to advise of Test Results, no answer, Left VM.

## 2018-09-13 NOTE — Telephone Encounter (Signed)
-----   Message from Allie Bossier, MD sent at 09/10/2018  8:26 AM EST ----- Please let her know that I called in a prescription for BV. Thanks

## 2018-09-13 NOTE — Telephone Encounter (Signed)
I left a voicemail making sure that she did take the keflex for her UTI. I told her that if she hadn't taken it, then she should call the office for a prescription.

## 2018-09-14 ENCOUNTER — Encounter: Payer: Self-pay | Admitting: *Deleted

## 2018-09-14 ENCOUNTER — Other Ambulatory Visit: Payer: Self-pay

## 2018-09-14 MED ORDER — METRONIDAZOLE 1 % EX GEL: Freq: Every day | CUTANEOUS | 0 refills | Status: DC

## 2018-09-14 NOTE — Telephone Encounter (Signed)
Called patient to inform her of BV results and that flagyl has been called into her [facility] pharmacy. Patient reports not being able to take pills and that it will make her sick. She is requesting metrogel to help with BV. I have advised patient that her insurance may not cover this medication but I will go ahead and call it in for her.

## 2018-09-14 NOTE — Telephone Encounter (Signed)
Patient is requesting metrogel instead of pills. She can not take pills.

## 2018-09-21 ENCOUNTER — Encounter (HOSPITAL_COMMUNITY): Payer: Self-pay | Admitting: *Deleted

## 2018-09-21 ENCOUNTER — Other Ambulatory Visit: Payer: Self-pay

## 2018-09-21 ENCOUNTER — Inpatient Hospital Stay (HOSPITAL_COMMUNITY)
Admission: AD | Admit: 2018-09-21 | Discharge: 2018-09-21 | Disposition: A | Discharge status: Home / Self Care | Payer: Medicaid Other | Attending: Obstetrics & Gynecology | Admitting: Obstetrics & Gynecology

## 2018-09-21 DIAGNOSIS — R05 Cough: Secondary | ICD-10-CM | POA: Diagnosis present

## 2018-09-21 DIAGNOSIS — Z3A12 12 weeks gestation of pregnancy: Secondary | ICD-10-CM | POA: Insufficient documentation

## 2018-09-21 DIAGNOSIS — J069 Acute upper respiratory infection, unspecified: Secondary | ICD-10-CM | POA: Diagnosis not present

## 2018-09-21 DIAGNOSIS — O99511 Diseases of the respiratory system complicating pregnancy, first trimester: Secondary | ICD-10-CM | POA: Insufficient documentation

## 2018-09-21 LAB — INFLUENZA PANEL BY PCR (TYPE A & B)
Influenza A By PCR: NEGATIVE
Influenza B By PCR: NEGATIVE

## 2018-09-21 LAB — URINALYSIS, ROUTINE W REFLEX MICROSCOPIC
Bilirubin Urine: NEGATIVE
Glucose, UA: NEGATIVE mg/dL
Hgb urine dipstick: NEGATIVE
Ketones, ur: NEGATIVE mg/dL
Nitrite: NEGATIVE
PH: 6 (ref 5.0–8.0)
Protein, ur: NEGATIVE mg/dL
Specific Gravity, Urine: 1.023 (ref 1.005–1.030)

## 2018-09-21 MED ORDER — BENZONATATE 100 MG PO CAPS: 100.0000 mg | ORAL_CAPSULE | Freq: Three times a day (TID) | ORAL | 0 refills | Status: DC | PRN

## 2018-09-21 NOTE — MAU Provider Note (Signed)
History     CSN: [id number]  Arrival date and time: 09/21/18 5284   First Provider Initiated Contact with Patient 09/21/18 1124      Chief Complaint  Patient presents with  . Emesis  . Cough   G3P1011 '@12' .4 wks presenting with cough and N/V. Sx started 1 week ago. Cough is productive with yellow phlegm. Associated sx are sore throat and HA. Occasional body aches. Her daughter has a "chronic cough". Vomiting is triggered by the coughing. Reports emesis about 2 times a day. Had diarrhea 3 days ago. No fevers. No VB or abd pain.    OB History    Gravida  3   Para  1   Term  1   Preterm  0   AB  1   Living  1     SAB  1   TAB  0   Ectopic  0   Multiple  0   Live Births  1           Past Medical History:  Diagnosis Date  . Anxiety   . Asthma   . Chlamydia   . Depression    doing ok  . Headache(784.0)   . Infection    UTI  . Pseudotumor cerebri   . Trichomonas     Past Surgical History:  Procedure Laterality Date  . LUMBAR PUNCTURE    . NO PAST SURGERIES    . WISDOM TOOTH EXTRACTION      Family History  Problem Relation Age of Onset  . Goiter Mother   . Hypertension Mother   . Ulcers Father   . Anesthesia problems Neg Hx   . Hearing loss Neg Hx     Social History   Tobacco Use  . Smoking status: Never Smoker  . Smokeless tobacco: Never Used  Substance Use Topics  . Alcohol use: Yes    Alcohol/week: 1.0 standard drinks    Types: 1 Glasses of wine per week    Comment: rare  . Drug use: Not Currently    Types: Marijuana    Comment: Nov    Allergies:  Allergies  Allergen Reactions  . Shellfish Allergy Anaphylaxis  . Doxycycline Other (See Comments)    Causes swelling behind the eyes; aggravates PTC condition    Medications Prior to Admission  Medication Sig Dispense Refill Last Dose  . acetaminophen (TYLENOL) 500 MG tablet Take 2 tablets (1,000 mg total) by mouth every 6 (six) hours as needed. 30 tablet 0   . albuterol  (PROVENTIL HFA;VENTOLIN HFA) 108 (90 BASE) MCG/ACT inhaler Inhale 1-2 puffs into the lungs every 6 (six) hours as needed for wheezing. 1 Inhaler 0 Past Week at Unknown time  . albuterol (PROVENTIL HFA;VENTOLIN HFA) 108 (90 Base) MCG/ACT inhaler Inhale 1-2 puffs into the lungs every 6 (six) hours as needed for wheezing or shortness of breath. 1 Inhaler 0   . cephALEXin (KEFLEX) 500 MG capsule Take 1 capsule (500 mg total) by mouth 4 (four) times daily. 28 capsule 0   . metroNIDAZOLE (FLAGYL) 500 MG tablet Take 1 tablet (500 mg total) by mouth 2 (two) times daily. 14 tablet 0   . metroNIDAZOLE (METROGEL) 1 % gel Apply topically daily. PLEASE USE FOR FIVE DAYS 45 g 0     Review of Systems  Constitutional: Negative for chills and fever.  HENT: Positive for congestion and sore throat. Negative for ear pain.   Respiratory: Positive for cough and chest tightness.   Gastrointestinal:  Positive for vomiting. Negative for abdominal pain and nausea.  Genitourinary: Negative for vaginal bleeding.   Physical Exam   Blood pressure 117/68, pulse 88, temperature 98.4 F (36.9 C), temperature source Oral, resp. rate 18, weight 122.7 kg, last menstrual period 07/01/2018, SpO2 100 %.  Physical Exam  Nursing note and vitals reviewed. Constitutional: She is oriented to person, place, and time. She appears well-developed and well-nourished. No distress.  HENT:  Head: Normocephalic and atraumatic.  Right Ear: Hearing, tympanic membrane, external ear and ear canal normal.  Left Ear: Hearing, tympanic membrane, external ear and ear canal normal.  Nose: Nose normal.  Mouth/Throat: Uvula is midline, oropharynx is clear and moist and mucous membranes are normal.  Neck: Normal range of motion.  Cardiovascular: Normal rate, regular rhythm and normal heart sounds.  Respiratory: Effort normal and breath sounds normal. No respiratory distress. She has no wheezes. She has no rales.  Musculoskeletal: Normal range of  motion.  Neurological: She is alert and oriented to person, place, and time.  Skin: Skin is warm and dry.  Psychiatric: She has a normal mood and affect.   Unable to obtain FHT by doppler>Limited bedside US: viable, active fetus, +cardiac activity, subj. nml AFV  Results for orders placed or performed during the hospital encounter of 09/21/18 (from the past 24 hour(s))  Urinalysis, Routine w reflex microscopic     Status: Abnormal   Collection Time: 09/21/18  9:27 AM  Result Value Ref Range   Color, Urine YELLOW YELLOW   APPearance CLOUDY (A) CLEAR   Specific Gravity, Urine 1.023 1.005 - 1.030   pH 6.0 5.0 - 8.0   Glucose, UA NEGATIVE NEGATIVE mg/dL   Hgb urine dipstick NEGATIVE NEGATIVE   Bilirubin Urine NEGATIVE NEGATIVE   Ketones, ur NEGATIVE NEGATIVE mg/dL   Protein, ur NEGATIVE NEGATIVE mg/dL   Nitrite NEGATIVE NEGATIVE   Leukocytes,Ua LARGE (A) NEGATIVE   RBC / HPF 0-5 0 - 5 RBC/hpf   WBC, UA 11-20 0 - 5 WBC/hpf   Bacteria, UA MANY (A) NONE SEEN   Squamous Epithelial / LPF 6-10 0 - 5   Mucus PRESENT   Influenza panel by PCR (type A & B)     Status: None   Collection Time: 09/21/18 11:50 AM  Result Value Ref Range   Influenza A By PCR NEGATIVE NEGATIVE   Influenza B By PCR NEGATIVE NEGATIVE   MAU Course  Procedures  MDM Labs ordered and reviewed. No evidence of flu, sx likely viral. Discussed supportive measures. UA with many bacteria, pt asymptomatic, will send UC. Stable for discharge home.   Assessment and Plan   1. [redacted] weeks gestation of pregnancy   2. Upper respiratory virus    Discharge home Follow up at [facility] as scheduled Rx Tessalon Perles OTC cold meds  Allergies as of 09/21/2018      Reactions   Shellfish Allergy Anaphylaxis   Doxycycline Other (See Comments)   Causes swelling behind the eyes; aggravates PTC condition      Medication List    STOP taking these medications   cephALEXin 500 MG capsule Commonly known as:  KEFLEX   metroNIDAZOLE 1 %  gel Commonly known as:  METROGEL   metroNIDAZOLE 500 MG tablet Commonly known as:  FLAGYL     TAKE these medications   acetaminophen 500 MG tablet Commonly known as:  TYLENOL Take 2 tablets (1,000 mg total) by mouth every 6 (six) hours as needed.   albuterol 108 (90 Base)  MCG/ACT inhaler Commonly known as:  PROVENTIL HFA;VENTOLIN HFA Inhale 1-2 puffs into the lungs every 6 (six) hours as needed for wheezing.   albuterol 108 (90 Base) MCG/ACT inhaler Commonly known as:  PROVENTIL HFA;VENTOLIN HFA Inhale 1-2 puffs into the lungs every 6 (six) hours as needed for wheezing or shortness of breath.   benzonatate 100 MG capsule Commonly known as:  TESSALON Take 1 capsule (100 mg total) by mouth 3 (three) times daily as needed for cough.      Julianne Handler, CNM 09/21/2018, 11:41 AM

## 2018-09-21 NOTE — Discharge Instructions (Signed)

## 2018-09-21 NOTE — MAU Note (Signed)
Ongoing vomiting for a wk, saw blood in it, green stuff.  Tired. On going cough.  Voice is hoarse, sore throat. Has not checked temp.

## 2018-09-23 LAB — CULTURE, OB URINE: Culture: 80000 — AB

## 2018-09-27 ENCOUNTER — Telehealth: Payer: Self-pay | Admitting: Women's Health

## 2018-09-27 DIAGNOSIS — O2341 Unspecified infection of urinary tract in pregnancy, first trimester: Secondary | ICD-10-CM

## 2018-09-27 MED ORDER — CEPHALEXIN 500 MG PO CAPS: 500.0000 mg | ORAL_CAPSULE | Freq: Four times a day (QID) | ORAL | 0 refills | Status: AC

## 2018-09-27 NOTE — Telephone Encounter (Signed)
Called and spoke with patient. Relayed urine culture results and plan for medication RX. Confirmed allergies to doxycycline and shellfish. Pt verbalized understanding and questions answered to patient satisfaction.

## 2018-10-06 ENCOUNTER — Ambulatory Visit (INDEPENDENT_AMBULATORY_CARE_PROVIDER_SITE_OTHER): Payer: Medicaid Other | Admitting: Obstetrics & Gynecology

## 2018-10-06 ENCOUNTER — Other Ambulatory Visit: Payer: Self-pay

## 2018-10-06 ENCOUNTER — Encounter: Payer: Self-pay | Admitting: Obstetrics & Gynecology

## 2018-10-06 VITALS — BP 113/65 | HR 93 | Wt 277.0 lb

## 2018-10-06 DIAGNOSIS — Z8632 Personal history of gestational diabetes: Secondary | ICD-10-CM

## 2018-10-06 DIAGNOSIS — O99212 Obesity complicating pregnancy, second trimester: Secondary | ICD-10-CM

## 2018-10-06 DIAGNOSIS — O0990 Supervision of high risk pregnancy, unspecified, unspecified trimester: Secondary | ICD-10-CM

## 2018-10-06 DIAGNOSIS — Z3A14 14 weeks gestation of pregnancy: Secondary | ICD-10-CM

## 2018-10-06 DIAGNOSIS — O99210 Obesity complicating pregnancy, unspecified trimester: Secondary | ICD-10-CM | POA: Insufficient documentation

## 2018-10-06 NOTE — Progress Notes (Signed)
   PRENATAL VISIT NOTE  Subjective:  Ariana Bentley is a 28 y.o. G3P1011 at [redacted]w[redacted]d being seen today for ongoing prenatal care.  She is currently monitored for the following issues for this high-risk pregnancy and has Pseudotumor cerebri; Supervision of high risk pregnancy, antepartum; History of gestational diabetes; and Obesity in pregnancy on their problem list.  Patient reports no complaints.  Contractions: Not present. Vag. Bleeding: None.  Movement: Absent. Denies leaking of fluid.   The following portions of the patient's history were reviewed and updated as appropriate: allergies, current medications, past family history, past medical history, past social history, past surgical history and problem list.   Objective:   Vitals:   10/06/18 0927  BP: 113/65  Pulse: 93  Weight: 277 lb (125.6 kg)    Fetal Status: Fetal Heart Rate (bpm): 144   Movement: Absent     General:  Alert, oriented and cooperative. Patient is in no acute distress.  Skin: Skin is warm and dry. No rash noted.   Cardiovascular: Normal heart rate noted  Respiratory: Normal respiratory effort, no problems with respiration noted  Abdomen: Soft, gravid, appropriate for gestational age.  Pain/Pressure: Absent     Pelvic: Cervical exam deferred        Extremities: Normal range of motion.  Edema: None  Mental Status: Normal mood and affect. Normal behavior. Normal judgment and thought content.   Assessment and Plan:  Pregnancy: G3P1011 at [redacted]w[redacted]d 1. History of gestational diabetes Normal HgbA1C.  Will do 2hr GTT.  2. Obesity in pregnancy Gained #8 lbs so far, 11-20 lb TWG recommended.  3. Supervision of high risk pregnancy, antepartum Low risk female on NIPS. AFP screen next visit. Anatomy scan scheduled. No other complaints or concerns.  Routine obstetric precautions reviewed.  Return in about 4 weeks (around 11/03/2018) for AFP only lab, , OB Visit (HOB).  Future Appointments  Date Time Provider Department Center   11/05/2018 10:15 AM WH-MFC [place] 4 WH-MFCUS MFC-US    Jaynie Collins, MD

## 2018-10-06 NOTE — Patient Instructions (Addendum)
    RE: MyChart  Dear Ms. Kimmet  We are excited to introduce MyChart, a new best-in-class service that provides you online access to important information in your electronic medical record. We want to make it easier for you to view your health information - all in one secure location - when and where you need it. We expect MyChart will enhance the quality of care and service we provide. Use the activation code below to enroll in MyChart online at [email or web address]  When you register for MyChart, you can:  Marland Kitchen View your test results. . Communicate securely with your physician's office.  . View your medical history, allergies, medications, and immunizations. . Conveniently print information such as your medication lists.  If you are age 27 or older and want a member of your family to have access to your record, you must provide written consent by completing a proxy form available at our facility. Please speak to our clinical staff about guidelines regarding accounts for patients younger than age 50.  As you activate your MyChart account and need any technical assistance, please call the MyChart technical support line at [phone]).  Thank you for using MyChart as your new health and wellness resource!  MyChart Activation Code:  [email or web address] Expires: 11/20/2018  9:45 AM           [facility]  [address] [place], [place] [address]  Return to office for any scheduled appointments. Call the office or go to the MAU at [facility] if:  You begin to have strong, frequent contractions  Your water breaks.  Sometimes it is a big gush of fluid, sometimes it is just a trickle that keeps getting your panties wet or running down your legs  You have vaginal bleeding.  It is normal to have a small amount of spotting if your cervix was checked.   You do not feel your baby moving like normal.  If you do not, get something to eat  and drink and lay down and focus on feeling your baby move.   If your baby is still not moving like normal, you should call the office or go to MAU.  Any other obstetric concerns.

## 2018-10-12 ENCOUNTER — Encounter: Payer: Self-pay | Admitting: *Deleted

## 2018-11-05 ENCOUNTER — Other Ambulatory Visit (HOSPITAL_COMMUNITY): Payer: Self-pay

## 2018-11-08 ENCOUNTER — Other Ambulatory Visit: Payer: Self-pay | Admitting: General Practice

## 2018-11-08 ENCOUNTER — Encounter: Payer: Self-pay | Admitting: Obstetrics & Gynecology

## 2018-11-08 DIAGNOSIS — O0990 Supervision of high risk pregnancy, unspecified, unspecified trimester: Secondary | ICD-10-CM

## 2018-11-19 ENCOUNTER — Other Ambulatory Visit: Payer: Self-pay

## 2018-11-19 ENCOUNTER — Other Ambulatory Visit: Payer: Self-pay | Admitting: Obstetrics & Gynecology

## 2018-11-19 ENCOUNTER — Ambulatory Visit (HOSPITAL_COMMUNITY)
Admission: RE | Admit: 2018-11-19 | Discharge: 2018-11-19 | Disposition: A | Discharge status: Home / Self Care | Payer: Medicaid Other | Source: Ambulatory Visit | Attending: Obstetrics and Gynecology | Admitting: Obstetrics and Gynecology

## 2018-11-19 ENCOUNTER — Other Ambulatory Visit (HOSPITAL_COMMUNITY): Payer: Self-pay | Admitting: *Deleted

## 2018-11-19 DIAGNOSIS — O0990 Supervision of high risk pregnancy, unspecified, unspecified trimester: Secondary | ICD-10-CM | POA: Diagnosis not present

## 2018-11-19 DIAGNOSIS — O2692 Pregnancy related conditions, unspecified, second trimester: Secondary | ICD-10-CM | POA: Diagnosis not present

## 2018-11-19 DIAGNOSIS — O99210 Obesity complicating pregnancy, unspecified trimester: Secondary | ICD-10-CM

## 2018-11-19 DIAGNOSIS — O09292 Supervision of pregnancy with other poor reproductive or obstetric history, second trimester: Secondary | ICD-10-CM

## 2018-11-19 DIAGNOSIS — Z3A21 21 weeks gestation of pregnancy: Secondary | ICD-10-CM | POA: Diagnosis not present

## 2018-11-19 DIAGNOSIS — O99212 Obesity complicating pregnancy, second trimester: Secondary | ICD-10-CM

## 2018-12-06 ENCOUNTER — Telehealth: Payer: Self-pay | Admitting: Obstetrics & Gynecology

## 2018-12-06 NOTE — Telephone Encounter (Signed)
Called the patient to inform of the upcoming virtual appointment. The patient downloaded the app while we were on the call together.

## 2018-12-21 ENCOUNTER — Telehealth: Payer: Medicaid Other | Admitting: Physician Assistant

## 2018-12-21 ENCOUNTER — Telehealth (INDEPENDENT_AMBULATORY_CARE_PROVIDER_SITE_OTHER): Payer: Medicaid Other | Admitting: Obstetrics & Gynecology

## 2018-12-21 ENCOUNTER — Other Ambulatory Visit: Payer: Self-pay

## 2018-12-21 DIAGNOSIS — M549 Dorsalgia, unspecified: Secondary | ICD-10-CM | POA: Diagnosis not present

## 2018-12-21 DIAGNOSIS — O0992 Supervision of high risk pregnancy, unspecified, second trimester: Secondary | ICD-10-CM

## 2018-12-21 DIAGNOSIS — O9989 Other specified diseases and conditions complicating pregnancy, childbirth and the puerperium: Secondary | ICD-10-CM | POA: Diagnosis not present

## 2018-12-21 DIAGNOSIS — Z3A25 25 weeks gestation of pregnancy: Secondary | ICD-10-CM

## 2018-12-21 DIAGNOSIS — O99891 Other specified diseases and conditions complicating pregnancy: Secondary | ICD-10-CM

## 2018-12-21 DIAGNOSIS — O0990 Supervision of high risk pregnancy, unspecified, unspecified trimester: Secondary | ICD-10-CM

## 2018-12-21 MED ORDER — COMFORT FIT MATERNITY SUPP LG MISC: 1.0000 [IU] | Freq: Every day | 0 refills | Status: DC

## 2018-12-21 NOTE — Patient Instructions (Signed)

## 2018-12-21 NOTE — Progress Notes (Signed)
We are sorry that you are not feeling well.  Here is how we plan to help!  Based on what you have shared with me it looks like you mostly have acute back pain.  Acute back pain is defined as musculoskeletal pain that can resolve in 1-3 weeks with conservative treatment.  For your back pain you may take tylenol 1000 mg every 6-8 hours as this is safe in pregnancy. Please ice your back 20 minutes on and 20 minutes off as often as you can to help with inflammation. Back pain is very common in pregnancy.  If at any point you have vaginal bleeding or pelvic pain please [facility].  The pain often gets better over time.  The cause of back pain is usually not dangerous.  Most people can learn to manage their back pain on their own.  Home Care Stay active.  Start with short walks on flat ground if you can.  Try to walk farther each day. Do not sit, drive or stand in one place for more than 30 minutes.  Do not stay in bed. Do not avoid exercise or work.  Activity can help your back heal faster. Be careful when you bend or lift an object.  Bend at your knees, keep the object close to you, and do not twist. Sleep on a firm mattress.  Lie on your side, and bend your knees.  If you lie on your back, put a pillow under your knees. Only take medicines as told by your doctor. Put ice on the injured area. Put ice in a plastic bag Place a towel between your skin and the bag Leave the ice on for 15-20 minutes, 3-4 times a day for the first 2-3 days. 210 After that, you can switch between ice and heat packs. Ask your doctor about back exercises or massage. Avoid feeling anxious or stressed.  Find good ways to deal with stress, such as exercise.  Get Help Right Way If: Your pain does not go away with rest or medicine. Your pain does not go away in 1 week. You have new problems. You do not feel well. The pain spreads into your legs. You cannot control when you poop (bowel movement) or pee  (urinate) You feel sick to your stomach (nauseous) or throw up (vomit) You have belly (abdominal) pain. You feel like you may pass out (faint). If you develop a fever.  Make Sure you: Understand these instructions. Will watch your condition Will get help right away if you are not doing well or get worse.  Your e-visit answers were reviewed by a board certified advanced clinical practitioner to complete your personal care plan.  Depending on the condition, your plan could have included both over the counter or prescription medications.  If there is a problem please reply once you have received a response from your provider.  Your safety is important to us.  If you have drug allergies check your prescription carefully.    You can use MyChart to ask questions about today's visit, request a non-urgent call back, or ask for a work or school excuse for 24 hours related to this e-Visit. If it has been greater than 24 hours you will need to follow up with your provider, or enter a new e-Visit to address those concerns.  You will get an e-mail in the next two days asking about your experience.  I hope that your e-visit has been valuable and will speed your  recovery. Thank you for using e-visits.   ===View-only below this line===   ----- Message -----    From: Henderson Cloud    Sent: 12/21/2018  8:36 AM EDT      To: E-Visit Mailing List Subject: E-Visit Submission: Back Pain  E-Visit Submission: Back Pain --------------------------------  Question: Where are you having pain Answer:   Lower back  Question: Does the pain extend into your legs? Answer:   Yes, into both legs  Question: Are you having any numbness or weakness of the legs? Answer:   Yes  Question: Does this pain radiate to the abdomen or groin? Answer:   Yes  Question: How bad is the pain? Answer:   The pain is moderate  Question: Did you have an injury that caused the pain? Answer:   No, I cannot remember an  injury  Question: How long has the pain been present? Answer:   More than 2 days but less than 1 week  Question: Have you had back pain in the past? Answer:   I have had back pain before, but this is markedly different  Question: Do you have a history of fever associated with your back pain? Answer:   No  Question: Please list any medications you have previously taken for back pain. Answer:   N/a  Question: Do you have a fever? Answer:   No, I do not have a fever  Question: Do you have any of the following? Answer:   Areas that are numb or have a strange sensation            Fatigue  Question: What makes the pain worse? Answer:   Bending over            Strenuous activity  Question: What makes the pain better Answer:   Lying in bed  Question: Do you get relief with certain positions (even if only partial relief)? Answer:   No  Question: Have you ever been diagnosed with cancer? Answer:   No  Question: Have you ever been diagnosed with arthritis? Answer:   No  Question: Have you ever been diagnosed with osteoporosis or any other bone weakness? Answer:   No  Question: Have you ever had surgery on your back or spine? Answer:   No  Question: Do you have a history of Intravenous Drug Use? Answer:   No  Question: What is your usual health status? Answer:   I am active and can move normally  Question: Are you pregnant? Answer:   I am pregnant  Question: Are you breastfeeding? Answer:   No  Question: Please list your medication allergies that you may have ? (If 'none' , please list as 'none') Answer:   N/a  Question: Please list any additional comments  Answer:     A total of 5-10 minutes was spent evaluating this patients questionnaire and formulating a plan of care.

## 2018-12-21 NOTE — Progress Notes (Signed)
   TELEHEALTH VIRTUAL OBSTETRICS VISIT ENCOUNTER NOTE  I connected with Brycelyn Wrobleski on 12/21/18 at  9:15 AM EDT by telephone at home and verified that I am speaking with the correct person using two identifiers.   I discussed the limitations, risks, security and privacy concerns of performing an evaluation and management service by telephone and the availability of in person appointments. I also discussed with the patient that there may be a patient responsible charge related to this service. The patient expressed understanding and agreed to proceed.  Subjective:  Kenra Esselman is a 28 y.o. G3P1011 at [redacted]w[redacted]d being followed for ongoing prenatal care.  She is currently monitored for the following issues for this low-risk pregnancy and has Pseudotumor cerebri; Supervision of high risk pregnancy, antepartum; History of gestational diabetes; and Obesity in pregnancy on their problem list.  Patient reports heartburn. Reports fetal movement. Denies any contractions, bleeding or leaking of fluid.   The following portions of the patient's history were reviewed and updated as appropriate: allergies, current medications, past family history, past medical history, past social history, past surgical history and problem list.   Objective:   General:  Alert, oriented and cooperative.   Mental Status: Normal mood and affect perceived. Normal judgment and thought content.  Rest of physical exam deferred due to type of encounter  Assessment and Plan:  Pregnancy: G3P1011 at [redacted]w[redacted]d 1. Supervision of high risk pregnancy, antepartum Hand numb, low ab pressure - Elastic Bandages & Supports (COMFORT FIT MATERNITY SUPP LG) MISC; 1 Units by Does not apply route daily.  Dispense: 1 each; Refill: 0 - Wrist brace cock up volar - [place] MFM OB FOLLOW UP; Future  Preterm labor symptoms and general obstetric precautions including but not limited to vaginal bleeding, contractions, leaking of fluid and fetal movement were  reviewed in detail with the patient.  I discussed the assessment and treatment plan with the patient. The patient was provided an opportunity to ask questions and all were answered. The patient agreed with the plan and demonstrated an understanding of the instructions. The patient was advised to call back or seek an in-person office evaluation/go to MAU at [facility] for any urgent or concerning symptoms. Please refer to After Visit Summary for other counseling recommendations.   I provided 12 minutes of non-face-to-face time during this encounter.  Return in about 2 weeks (around 01/04/2019) for next visit 2 hr gtt/ 28 week labs/  new ob labs  and ofbu.  Future Appointments  Date Time Provider Department Center  02/11/2019 10:15 AM WH-MFC [place] 4 WH-MFCUS MFC-US    Scheryl Darter, MD Center for [facility], [facility]

## 2018-12-21 NOTE — Progress Notes (Signed)
I connected with  Ariana Bentley on 12/21/18 at  9:15 AM EDT by telephone and verified that I am speaking with the correct person using two identifiers.a nd also asked her to give me her codeword.    I discussed the limitations, risks, security and privacy concerns of performing an evaluation and management service by telephone and virtually by MyChart and the availability of in person appointments. I also discussed with the patient that there may be a patient responsible charge related to this service. The patient expressed understanding and agreed to proceed.  Linda,RN 12/21/2018  9:26 AMStates does not have her cuff with her and cannot take her blood pressure. Informed her next visit will do fasting 2 hr gtt/ 28 wk labs.  C/o mild edema and numbeness in hands.

## 2018-12-31 ENCOUNTER — Other Ambulatory Visit: Payer: Self-pay | Admitting: *Deleted

## 2018-12-31 DIAGNOSIS — O0990 Supervision of high risk pregnancy, unspecified, unspecified trimester: Secondary | ICD-10-CM

## 2019-01-04 ENCOUNTER — Encounter: Payer: Self-pay | Admitting: Emergency Medicine

## 2019-01-04 ENCOUNTER — Other Ambulatory Visit: Payer: Medicaid Other

## 2019-01-04 ENCOUNTER — Telehealth: Payer: Self-pay | Admitting: Emergency Medicine

## 2019-01-04 ENCOUNTER — Other Ambulatory Visit: Payer: Self-pay

## 2019-01-04 ENCOUNTER — Ambulatory Visit (INDEPENDENT_AMBULATORY_CARE_PROVIDER_SITE_OTHER): Payer: Medicaid Other | Admitting: Internal Medicine

## 2019-01-04 VITALS — BP 123/79 | HR 99 | Wt 270.9 lb

## 2019-01-04 DIAGNOSIS — O26899 Other specified pregnancy related conditions, unspecified trimester: Secondary | ICD-10-CM

## 2019-01-04 DIAGNOSIS — O99212 Obesity complicating pregnancy, second trimester: Secondary | ICD-10-CM

## 2019-01-04 DIAGNOSIS — O26892 Other specified pregnancy related conditions, second trimester: Secondary | ICD-10-CM

## 2019-01-04 DIAGNOSIS — R102 Pelvic and perineal pain: Secondary | ICD-10-CM

## 2019-01-04 DIAGNOSIS — O99210 Obesity complicating pregnancy, unspecified trimester: Secondary | ICD-10-CM

## 2019-01-04 DIAGNOSIS — Z8632 Personal history of gestational diabetes: Secondary | ICD-10-CM

## 2019-01-04 DIAGNOSIS — Z3A27 27 weeks gestation of pregnancy: Secondary | ICD-10-CM

## 2019-01-04 DIAGNOSIS — O0990 Supervision of high risk pregnancy, unspecified, unspecified trimester: Secondary | ICD-10-CM

## 2019-01-04 MED ORDER — COMFORT FIT MATERNITY SUPP LG MISC: 1.0000 "application " | Freq: Every day | 0 refills | Status: AC | PRN

## 2019-01-04 NOTE — Progress Notes (Signed)
   PRENATAL VISIT NOTE  Subjective:  Ariana Bentley is a 28 y.o. G3P1011 at [redacted]w[redacted]d being seen today for ongoing prenatal care.  She is currently monitored for the following issues for this high-risk pregnancy and has Pseudotumor cerebri; Supervision of high risk pregnancy, antepartum; History of gestational diabetes; and Obesity in pregnancy on their problem list.  Patient reports pelvic pressure, requests maternity belt..  Contractions: Not present. Vag. Bleeding: None.  Movement: Present. Denies leaking of fluid.   The following portions of the patient's history were reviewed and updated as appropriate: allergies, current medications, past family history, past medical history, past social history, past surgical history and problem list.   Objective:   Vitals:   01/04/19 1038  BP: 123/79  Pulse: 99  Weight: 122.9 kg    Fetal Status: Fetal Heart Rate (bpm): 156 Fundal Height: 32 cm Movement: Present     General:  Alert, oriented and cooperative. Patient is in no acute distress.  Skin: Skin is warm and dry. No rash noted.   Cardiovascular: Normal heart rate noted  Respiratory: Normal respiratory effort, no problems with respiration noted  Abdomen: Soft, gravid, appropriate for gestational age.  Pain/Pressure: Present     Pelvic: Cervical exam deferred        Extremities: Normal range of motion.  Edema: None  Mental Status: Normal mood and affect. Normal behavior. Normal judgment and thought content.   Assessment and Plan:  Pregnancy: G3P1011 at [redacted]w[redacted]d  1. Supervision of high risk pregnancy, antepartum Patient to return for 28 week labs and 2hr GTT because she was not fasting this AM.   2. History of gestational diabetesd Discussed that h/o GDM with prior pregnancy is a risk factor for GDM in subsequent pregnancies. Discussed potential AE of untreated/undiagnosed GDM in pregnancy. Patient understands importance of returning for fasting lab visit.    3. Obesity in pregnancy  4.  Pelvic pressure in pregnancy - Elastic Bandages & Supports (COMFORT FIT MATERNITY SUPP LG) MISC; 1 application by Does not apply route daily as needed.  Dispense: 1 each; Refill: 0  5. Large for dates Patient measuring 5 cm greater than gestational age. May be related to BMI but will obtain follow up ultrasound to ensure appropriate fetal growth.  - [place] MFM OB FOLLOW UP; Future   Preterm labor symptoms and general obstetric precautions including but not limited to vaginal bleeding, contractions, leaking of fluid and fetal movement were reviewed in detail with the patient. Please refer to After Visit Summary for other counseling recommendations.   Return in about 2 weeks (around 01/18/2019) for routine PNC.  Future Appointments  Date Time Provider [place]  01/10/2019  9:10 AM WOC-WOCA LAB WOC-WOCA WOC  01/19/2019  9:15 AM Sloan Leiter, MD [place] [facility]OC  02/11/2019 10:15 AM [place], DO

## 2019-01-04 NOTE — Telephone Encounter (Signed)
Called pt to notify her of ultrasound appointment for 01/06/19 @ 2:15pm. Pt was instructed to arrive 15 minutes prior to appointment time. Pt was also informed that she would be receiving a my chart message with the appointment information as well.

## 2019-01-04 NOTE — Patient Instructions (Signed)

## 2019-01-06 ENCOUNTER — Other Ambulatory Visit: Payer: Self-pay

## 2019-01-06 ENCOUNTER — Ambulatory Visit (HOSPITAL_COMMUNITY)
Admission: RE | Admit: 2019-01-06 | Discharge: 2019-01-06 | Disposition: A | Discharge status: Home / Self Care | Payer: Medicaid Other | Source: Ambulatory Visit | Attending: Obstetrics and Gynecology | Admitting: Obstetrics and Gynecology

## 2019-01-06 DIAGNOSIS — O26842 Uterine size-date discrepancy, second trimester: Secondary | ICD-10-CM

## 2019-01-06 DIAGNOSIS — O2692 Pregnancy related conditions, unspecified, second trimester: Secondary | ICD-10-CM

## 2019-01-06 DIAGNOSIS — Z362 Encounter for other antenatal screening follow-up: Secondary | ICD-10-CM

## 2019-01-06 DIAGNOSIS — O0990 Supervision of high risk pregnancy, unspecified, unspecified trimester: Secondary | ICD-10-CM | POA: Diagnosis not present

## 2019-01-06 DIAGNOSIS — Z3A27 27 weeks gestation of pregnancy: Secondary | ICD-10-CM

## 2019-01-06 DIAGNOSIS — O99212 Obesity complicating pregnancy, second trimester: Secondary | ICD-10-CM

## 2019-01-06 DIAGNOSIS — O09292 Supervision of pregnancy with other poor reproductive or obstetric history, second trimester: Secondary | ICD-10-CM

## 2019-01-10 ENCOUNTER — Telehealth: Payer: Self-pay | Admitting: Advanced Practice Midwife

## 2019-01-10 ENCOUNTER — Other Ambulatory Visit: Payer: Medicaid Other

## 2019-01-10 NOTE — Telephone Encounter (Signed)
The patient called in to reschedule the visit. She stated its hard for her to go long periods without eating. She also stated she started to vomit. She stated she would like to try the 2 hour again Friday.

## 2019-01-12 ENCOUNTER — Telehealth: Payer: Self-pay | Admitting: Family Medicine

## 2019-01-12 NOTE — Telephone Encounter (Signed)
Spoke with patient about her appointment on 6/19 @ 8:50. Patient was instructed to wear a face mask and no visitors are allowed with her. Patient was screened for covid symptoms and denied having any

## 2019-01-14 ENCOUNTER — Other Ambulatory Visit: Payer: Self-pay | Admitting: *Deleted

## 2019-01-14 ENCOUNTER — Other Ambulatory Visit: Payer: Medicaid Other

## 2019-01-14 NOTE — Progress Notes (Signed)
Opened in error

## 2019-01-18 ENCOUNTER — Telehealth: Payer: Self-pay | Admitting: Family Medicine

## 2019-01-18 NOTE — Telephone Encounter (Signed)
Patient was called and instructed about her visit for 01/19/2019. °

## 2019-01-19 ENCOUNTER — Other Ambulatory Visit: Payer: Self-pay

## 2019-01-19 ENCOUNTER — Telehealth (INDEPENDENT_AMBULATORY_CARE_PROVIDER_SITE_OTHER): Payer: Medicaid Other | Admitting: Obstetrics and Gynecology

## 2019-01-19 ENCOUNTER — Encounter: Payer: Self-pay | Admitting: Obstetrics and Gynecology

## 2019-01-19 DIAGNOSIS — O0990 Supervision of high risk pregnancy, unspecified, unspecified trimester: Secondary | ICD-10-CM

## 2019-01-19 DIAGNOSIS — Z3A29 29 weeks gestation of pregnancy: Secondary | ICD-10-CM

## 2019-01-19 DIAGNOSIS — O0993 Supervision of high risk pregnancy, unspecified, third trimester: Secondary | ICD-10-CM

## 2019-01-19 DIAGNOSIS — Z8632 Personal history of gestational diabetes: Secondary | ICD-10-CM

## 2019-01-19 NOTE — Progress Notes (Signed)
   [place] VIRTUAL VIDEO VISIT ENCOUNTER NOTE  Provider location: Center for [facility] at [facility]   I connected with [name] on 01/19/19 at  9:15 AM EDT by MyChart Video Encounter at home and verified that I am speaking with the correct person using two identifiers.   I discussed the limitations, risks, security and privacy concerns of performing an evaluation and management service by telephone and the availability of in person appointments. I also discussed with the patient that there may be a patient responsible charge related to this service. The patient expressed understanding and agreed to proceed. Subjective:  Ariana Bentley is a 28 y.o. G3P1011 at [redacted]w[redacted]d being seen today for ongoing prenatal care.  She is currently monitored for the following issues for this low-risk pregnancy and has Pseudotumor cerebri; Supervision of high risk pregnancy, antepartum; History of gestational diabetes; and Obesity in pregnancy on their problem list.  Patient reports no complaints.  Contractions: Not present. Vag. Bleeding: None.  Movement: Present. Denies any leaking of fluid.   The following portions of the patient's history were reviewed and updated as appropriate: allergies, current medications, past family history, past medical history, past social history, past surgical history and problem list.   Objective:  There were no vitals filed for this visit.  Fetal Status:     Movement: Present     General:  Alert, oriented and cooperative. Patient is in no acute distress.  Respiratory: Normal respiratory effort, no problems with respiration noted  Mental Status: Normal mood and affect. Normal behavior. Normal judgment and thought content.  Rest of physical exam deferred due to type of encounter   Assessment and Plan:  Pregnancy: G3P1011 at [redacted]w[redacted]d  1. Supervision of high risk pregnancy, antepartum Doing well, no issues Has not done 2 hr GTT, refuses to check CBGs  or do 2 hr GTT Discussed maternal/fetal complications associated with poorly controlled diabetes including but not limited to risks of fetal macrosomia resulting in shoulder dystocia, risks of neurological injury, risks of c-section, IUFD, neonatal demise, neonatal hypoglycemia, neonatal seizures. Patient verbalized understanding and all questions were answered. - she will talk to her midwife about the GTT  2. History of gestational diabetes   Preterm labor symptoms and general obstetric precautions including but not limited to vaginal bleeding, contractions, leaking of fluid and fetal movement were reviewed in detail with the patient. I discussed the assessment and treatment plan with the patient. The patient was provided an opportunity to ask questions and all were answered. The patient agreed with the plan and demonstrated an understanding of the instructions. The patient was advised to call back or seek an in-person office evaluation/go to MAU at [facility] for any urgent or concerning symptoms. Please refer to After Visit Summary for other counseling recommendations.   I provided 15 minutes of face-to-face time during this encounter.  Return in about 2 weeks (around 02/02/2019) for OB visit (MD), virtual.  Future Appointments  Date Time Provider [place]  02/11/2019 10:15 AM WH-MFC [place], [place] for [place], [place]

## 2019-01-19 NOTE — Progress Notes (Signed)
I connected with  Edith Zech on 01/19/19 at  9:15 AM EDT by Mychart video and verified that I am speaking with the correct person using two identifiers.   I discussed the limitations, risks, security and privacy concerns of performing an evaluation and management service by telephone and the availability of in person appointments. I also discussed with the patient that there may be a patient responsible charge related to this service. The patient expressed understanding and agreed to proceed.  [place], CMA 01/19/2019  9:56 AM   Refuses 2hr gtt

## 2019-01-25 ENCOUNTER — Telehealth (INDEPENDENT_AMBULATORY_CARE_PROVIDER_SITE_OTHER): Payer: Medicaid Other | Admitting: *Deleted

## 2019-01-25 DIAGNOSIS — O0990 Supervision of high risk pregnancy, unspecified, unspecified trimester: Secondary | ICD-10-CM

## 2019-01-25 NOTE — Telephone Encounter (Signed)
Called pt regarding babyscripts.  Per review, pt has not logged a BP into the app since 12/09/18.  Pt states that she will take and log a BP into the app when she gets home today.

## 2019-01-26 ENCOUNTER — Telehealth: Payer: Self-pay | Admitting: Family Medicine

## 2019-01-26 ENCOUNTER — Other Ambulatory Visit: Payer: Self-pay | Admitting: *Deleted

## 2019-01-26 ENCOUNTER — Telehealth: Payer: Self-pay | Admitting: Obstetrics and Gynecology

## 2019-01-26 NOTE — Telephone Encounter (Signed)
Attempted to call patient about her appointment on 7/2 @ 9:10. No answer left detailed voicemail instructing the patient to wear a face mask for the entire appointment and no visitors are allowed. Patient instructed that if she has any symptoms to not come to the appointment and give the office a call to be rescheduled. Office number and list of symptoms were left.

## 2019-01-26 NOTE — Progress Notes (Signed)
Opened in error

## 2019-01-26 NOTE — Telephone Encounter (Signed)
Attempted to reach patient about coming in the office to get some lab work done. Left a message for her to call the office.

## 2019-01-27 ENCOUNTER — Other Ambulatory Visit: Payer: Medicaid Other

## 2019-02-03 ENCOUNTER — Other Ambulatory Visit: Payer: Self-pay

## 2019-02-03 ENCOUNTER — Telehealth: Payer: Self-pay | Admitting: Obstetrics & Gynecology

## 2019-02-03 ENCOUNTER — Telehealth (INDEPENDENT_AMBULATORY_CARE_PROVIDER_SITE_OTHER): Payer: Medicaid Other | Admitting: Obstetrics & Gynecology

## 2019-02-03 ENCOUNTER — Encounter: Payer: Self-pay | Admitting: Obstetrics & Gynecology

## 2019-02-03 VITALS — BP 112/77 | HR 76

## 2019-02-03 DIAGNOSIS — O99213 Obesity complicating pregnancy, third trimester: Secondary | ICD-10-CM | POA: Diagnosis not present

## 2019-02-03 DIAGNOSIS — O0990 Supervision of high risk pregnancy, unspecified, unspecified trimester: Secondary | ICD-10-CM

## 2019-02-03 DIAGNOSIS — O99210 Obesity complicating pregnancy, unspecified trimester: Secondary | ICD-10-CM

## 2019-02-03 DIAGNOSIS — G932 Benign intracranial hypertension: Secondary | ICD-10-CM | POA: Diagnosis not present

## 2019-02-03 DIAGNOSIS — O0993 Supervision of high risk pregnancy, unspecified, third trimester: Secondary | ICD-10-CM

## 2019-02-03 DIAGNOSIS — O9989 Other specified diseases and conditions complicating pregnancy, childbirth and the puerperium: Secondary | ICD-10-CM | POA: Diagnosis not present

## 2019-02-03 DIAGNOSIS — Z8632 Personal history of gestational diabetes: Secondary | ICD-10-CM

## 2019-02-03 NOTE — Progress Notes (Signed)
I connected with  Ariana Bentley on 02/03/19 at  2:35 PM EDT by telephone and verified that I am speaking with the correct person using two identifiers.   I discussed the limitations, risks, security and privacy concerns of performing an evaluation and management service by telephone and the availability of in person appointments. I also discussed with the patient that there may be a patient responsible charge related to this service. The patient expressed understanding and agreed to proceed.  [name], [place] 02/03/2019  2:29 PM  Patient states she is having lots of pressure and pain, maternity support belt not working.

## 2019-02-03 NOTE — Progress Notes (Signed)
   PRENATAL VISIT NOTE  Subjective:  Ariana Bentley is a 28 y.o. G3P1011 at [redacted]w[redacted]d being seen today for ongoing prenatal care.  She is currently monitored for the following issues for this high-risk pregnancy and has Pseudotumor cerebri; Supervision of high risk pregnancy, antepartum; History of gestational diabetes; and Obesity in pregnancy on their problem list.  Patient reports some pelvic pressure.  Contractions: Not present. Vag. Bleeding: None.  Movement: Present. Denies leaking of fluid.   The following portions of the patient's history were reviewed and updated as appropriate: allergies, current medications, past family history, past medical history, past social history, past surgical history and problem list.   Objective:   Vitals:   02/03/19 1428  BP: 112/77  Pulse: 76    Fetal Status:     Movement: Present     General:  Alert, oriented and cooperative. Patient is in no acute distress.  Skin: Skin is warm and dry. No rash noted.   Cardiovascular: Normal heart rate noted  Respiratory: Normal respiratory effort, no problems with respiration noted  Abdomen: Soft, gravid, appropriate for gestational age.  Pain/Pressure: Present     Pelvic: Cervical exam deferred        Extremities: Normal range of motion.  Edema: None  Mental Status: Normal mood and affect. Normal behavior. Normal judgment and thought content.   Assessment and Plan:  Pregnancy: G3P1011 at [redacted]w[redacted]d 1. Supervision of high risk pregnancy, antepartum Pt has a doula.  She is living in a hotel she says that social work reached out to her initially. Needs another f/u from social work    Pt did not have her 28 week labs done  Pt did not have ANY labs done   Pt will come in tomorrow for her GTT and labs at 0815.   2. Pseudotumor cerebri occ HA  3. Obesity in pregnancy Need [place] for growth   4. History of gestational diabetes  Preterm labor symptoms and general obstetric precautions including but not limited to  vaginal bleeding, contractions, leaking of fluid and fetal movement were reviewed in detail with the patient. Please refer to After Visit Summary for other counseling recommendations.   No follow-ups on file.  Future Appointments  Date Time Provider [place]  02/11/2019 10:15 AM [place] 4 [facility] M[facility]-US  02/17/2019  1:35 PM Lavonia Drafts, MD [facility] [place]  03/02/2019 10:55 AM Lavonia Drafts, MD [facility]  03/07/2019  1:15 PM Sloan Leiter, MD [facility]  03/14/2019  2:15 PM Anyanwu, Sallyanne Havers, MD [facility]  03/21/2019  1:15 PM Lavonia Drafts, MD [place]  03/28/2019  1:15 PM Truett Mainland, DO [facility] WOC    Lavonia Drafts, MD

## 2019-02-03 NOTE — Telephone Encounter (Signed)
Spoke to patient about her appointment on 7/9 @ 2:35. Patient instructed that the visit is a ,ychart appointment. Patient verbalized she has the app downloaded

## 2019-02-04 ENCOUNTER — Telehealth: Payer: Self-pay | Admitting: Obstetrics and Gynecology

## 2019-02-04 NOTE — Telephone Encounter (Signed)
The patient called in stating she was told at her virtual visit yesterday to reschedule the labs. I informed the patient of the 28 week lab reschedule and she stated she is not doing the 2 hour. She stated she was told we could complete a blood draw with out the 2-hour being done. Informed the patient I would send a message to the nurse regarding what she should be scheduled for as the check out notes does not indicate the lab visit.

## 2019-02-08 NOTE — Telephone Encounter (Signed)
Called patient, no answer- left message stating we are calling regarding your recent visit and scheduling a lab appt, please call us back.

## 2019-02-11 ENCOUNTER — Other Ambulatory Visit: Payer: Self-pay

## 2019-02-11 ENCOUNTER — Ambulatory Visit (HOSPITAL_COMMUNITY)
Admission: RE | Admit: 2019-02-11 | Discharge: 2019-02-11 | Disposition: A | Discharge status: Home / Self Care | Payer: Medicaid Other | Source: Ambulatory Visit | Attending: Obstetrics and Gynecology | Admitting: Obstetrics and Gynecology

## 2019-02-11 DIAGNOSIS — O99213 Obesity complicating pregnancy, third trimester: Secondary | ICD-10-CM | POA: Diagnosis not present

## 2019-02-11 DIAGNOSIS — Z362 Encounter for other antenatal screening follow-up: Secondary | ICD-10-CM

## 2019-02-11 DIAGNOSIS — O99210 Obesity complicating pregnancy, unspecified trimester: Secondary | ICD-10-CM | POA: Insufficient documentation

## 2019-02-11 DIAGNOSIS — O2693 Pregnancy related conditions, unspecified, third trimester: Secondary | ICD-10-CM

## 2019-02-11 DIAGNOSIS — O09293 Supervision of pregnancy with other poor reproductive or obstetric history, third trimester: Secondary | ICD-10-CM

## 2019-02-11 DIAGNOSIS — O26843 Uterine size-date discrepancy, third trimester: Secondary | ICD-10-CM

## 2019-02-11 DIAGNOSIS — Z3A33 33 weeks gestation of pregnancy: Secondary | ICD-10-CM

## 2019-02-14 ENCOUNTER — Other Ambulatory Visit: Payer: Self-pay

## 2019-02-14 ENCOUNTER — Inpatient Hospital Stay (HOSPITAL_COMMUNITY)
Admission: AD | Admit: 2019-02-14 | Discharge: 2019-02-15 | Disposition: A | Discharge status: Home / Self Care | Payer: Medicaid Other | Source: Ambulatory Visit | Attending: Obstetrics and Gynecology | Admitting: Obstetrics and Gynecology

## 2019-02-14 ENCOUNTER — Encounter (HOSPITAL_COMMUNITY): Payer: Self-pay

## 2019-02-14 DIAGNOSIS — Z3689 Encounter for other specified antenatal screening: Secondary | ICD-10-CM

## 2019-02-14 DIAGNOSIS — O4703 False labor before 37 completed weeks of gestation, third trimester: Secondary | ICD-10-CM | POA: Insufficient documentation

## 2019-02-14 DIAGNOSIS — O99513 Diseases of the respiratory system complicating pregnancy, third trimester: Secondary | ICD-10-CM | POA: Insufficient documentation

## 2019-02-14 DIAGNOSIS — R51 Headache: Secondary | ICD-10-CM | POA: Diagnosis not present

## 2019-02-14 DIAGNOSIS — Z79899 Other long term (current) drug therapy: Secondary | ICD-10-CM | POA: Insufficient documentation

## 2019-02-14 DIAGNOSIS — J45909 Unspecified asthma, uncomplicated: Secondary | ICD-10-CM | POA: Diagnosis not present

## 2019-02-14 DIAGNOSIS — Z3A33 33 weeks gestation of pregnancy: Secondary | ICD-10-CM | POA: Diagnosis not present

## 2019-02-14 DIAGNOSIS — R519 Headache, unspecified: Secondary | ICD-10-CM

## 2019-02-14 DIAGNOSIS — Z881 Allergy status to other antibiotic agents status: Secondary | ICD-10-CM | POA: Diagnosis not present

## 2019-02-14 DIAGNOSIS — O26893 Other specified pregnancy related conditions, third trimester: Secondary | ICD-10-CM | POA: Insufficient documentation
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
## 2019-02-15 DIAGNOSIS — Z3689 Encounter for other specified antenatal screening: Secondary | ICD-10-CM

## 2019-02-15 DIAGNOSIS — O4703 False labor before 37 completed weeks of gestation, third trimester: Secondary | ICD-10-CM

## 2019-02-15 DIAGNOSIS — O26893 Other specified pregnancy related conditions, third trimester: Secondary | ICD-10-CM

## 2019-02-15 DIAGNOSIS — R51 Headache: Secondary | ICD-10-CM

## 2019-02-15 DIAGNOSIS — Z3A33 33 weeks gestation of pregnancy: Secondary | ICD-10-CM

## 2019-02-15 LAB — URINALYSIS, ROUTINE W REFLEX MICROSCOPIC
Bilirubin Urine: NEGATIVE
Glucose, UA: NEGATIVE mg/dL
Hgb urine dipstick: NEGATIVE
Ketones, ur: 20 mg/dL — AB
Nitrite: NEGATIVE
Protein, ur: 30 mg/dL — AB
Specific Gravity, Urine: 1.018 (ref 1.005–1.030)
pH: 6 (ref 5.0–8.0)

## 2019-02-15 LAB — CULTURE, OB URINE

## 2019-02-15 MED ORDER — ACETAMINOPHEN 500 MG PO TABS
500.0000 mg | ORAL_TABLET | Freq: Four times a day (QID) | ORAL | Status: DC | PRN
  Administered 2019-02-15: 500 mg via ORAL
  Filled 2019-02-15: qty 1

## 2019-02-15 MED ORDER — NIFEDIPINE 10 MG PO CAPS
10.0000 mg | ORAL_CAPSULE | Freq: Once | ORAL | Status: DC
  Filled 2019-02-15: qty 1

## 2019-02-15 MED ORDER — ACETAMINOPHEN 500 MG PO TABS
1000.0000 mg | ORAL_TABLET | Freq: Four times a day (QID) | ORAL | Status: DC | PRN
  Filled 2019-02-15: qty 2

## 2019-02-15 NOTE — Discharge Instructions (Signed)
Braxton Hicks Contractions Contractions of the uterus can occur throughout pregnancy, but they are not always a sign that you are in labor. You may have practice contractions called Braxton Hicks contractions. These false labor contractions are sometimes confused with true labor. What are Braxton Hicks contractions? Braxton Hicks contractions are tightening movements that occur in the muscles of the uterus before labor. Unlike true labor contractions, these contractions do not result in opening (dilation) and thinning of the cervix. Toward the end of pregnancy (32-34 weeks), Braxton Hicks contractions can happen more often and may become stronger. These contractions are sometimes difficult to tell apart from true labor because they can be very uncomfortable. You should not feel embarrassed if you go to the hospital with false labor. Sometimes, the only way to tell if you are in true labor is for your health care provider to look for changes in the cervix. The health care provider will do a physical exam and may monitor your contractions. If you are not in true labor, the exam should show that your cervix is not dilating and your water has not broken. If there are no other health problems associated with your pregnancy, it is completely safe for you to be sent home with false labor. You may continue to have Braxton Hicks contractions until you go into true labor. How to tell the difference between true labor and false labor True labor  Contractions last 30-70 seconds.  Contractions become very regular.  Discomfort is usually felt in the top of the uterus, and it spreads to the lower abdomen and low back.  Contractions do not go away with walking.  Contractions usually become more intense and increase in frequency.  The cervix dilates and gets thinner. False labor  Contractions are usually shorter and not as strong as true labor contractions.  Contractions are usually irregular.  Contractions  are often felt in the front of the lower abdomen and in the groin.  Contractions may go away when you walk around or change positions while lying down.  Contractions get weaker and are shorter-lasting as time goes on.  The cervix usually does not dilate or become thin. Follow these instructions at home:   Take over-the-counter and prescription medicines only as told by your health care provider.  Keep up with your usual exercises and follow other instructions from your health care provider.  Eat and drink lightly if you think you are going into labor.  If Braxton Hicks contractions are making you uncomfortable: ? Change your position from lying down or resting to walking, or change from walking to resting. ? Sit and rest in a tub of warm water. ? Drink enough fluid to keep your urine pale yellow. Dehydration may cause these contractions. ? Do slow and deep breathing several times an hour.  Keep all follow-up prenatal visits as told by your health care provider. This is important. Contact a health care provider if:  You have a fever.  You have continuous pain in your abdomen. Get help right away if:  Your contractions become stronger, more regular, and closer together.  You have fluid leaking or gushing from your vagina.  You pass blood-tinged mucus (bloody show).  You have bleeding from your vagina.  You have low back pain that you never had before.  You feel your baby's head pushing down and causing pelvic pressure.  Your baby is not moving inside you as much as it used to. Summary  Contractions that occur before labor are   called Braxton Hicks contractions, false labor, or practice contractions.  Braxton Hicks contractions are usually shorter, weaker, farther apart, and less regular than true labor contractions. True labor contractions usually become progressively stronger and regular, and they become more frequent.  Manage discomfort from Braxton Hicks contractions  by changing position, resting in a warm bath, drinking plenty of water, or practicing deep breathing. This information is not intended to replace advice given to you by your health care provider. Make sure you discuss any questions you have with your health care provider. Document Released: 11/27/2016 Document Revised: 06/26/2017 Document Reviewed: 11/27/2016 Elsevier Patient Education  2020 [employer]  

## 2019-02-17 ENCOUNTER — Telehealth (INDEPENDENT_AMBULATORY_CARE_PROVIDER_SITE_OTHER): Payer: Medicaid Other | Admitting: Obstetrics & Gynecology

## 2019-02-17 ENCOUNTER — Other Ambulatory Visit: Payer: Self-pay

## 2019-02-17 VITALS — BP 109/62 | HR 86

## 2019-02-17 DIAGNOSIS — O0993 Supervision of high risk pregnancy, unspecified, third trimester: Secondary | ICD-10-CM

## 2019-02-17 DIAGNOSIS — O99210 Obesity complicating pregnancy, unspecified trimester: Secondary | ICD-10-CM

## 2019-02-17 DIAGNOSIS — O0990 Supervision of high risk pregnancy, unspecified, unspecified trimester: Secondary | ICD-10-CM

## 2019-02-17 DIAGNOSIS — Z8632 Personal history of gestational diabetes: Secondary | ICD-10-CM

## 2019-02-17 DIAGNOSIS — Z3A33 33 weeks gestation of pregnancy: Secondary | ICD-10-CM

## 2019-02-17 DIAGNOSIS — G932 Benign intracranial hypertension: Secondary | ICD-10-CM

## 2019-02-17 DIAGNOSIS — O99213 Obesity complicating pregnancy, third trimester: Secondary | ICD-10-CM

## 2019-02-17 NOTE — Addendum Note (Signed)
Addended by: Fidela Juneau A on: 02/17/2019 02:16 PM   Modules accepted: Orders
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
## 2019-02-18 ENCOUNTER — Other Ambulatory Visit: Payer: Medicaid Other

## 2019-03-02 ENCOUNTER — Telehealth: Payer: Medicaid Other | Admitting: Obstetrics & Gynecology

## 2019-03-02 ENCOUNTER — Encounter: Payer: Medicaid Other | Admitting: Obstetrics & Gynecology

## 2019-03-07 ENCOUNTER — Encounter: Payer: Medicaid Other | Admitting: Obstetrics and Gynecology

## 2019-03-11 ENCOUNTER — Other Ambulatory Visit: Payer: Self-pay

## 2019-03-11 ENCOUNTER — Ambulatory Visit (HOSPITAL_COMMUNITY)
Admission: RE | Admit: 2019-03-11 | Discharge: 2019-03-11 | Disposition: A | Discharge status: Home / Self Care | Payer: Medicaid Other | Source: Ambulatory Visit | Attending: Obstetrics and Gynecology | Admitting: Obstetrics and Gynecology

## 2019-03-11 DIAGNOSIS — Z362 Encounter for other antenatal screening follow-up: Secondary | ICD-10-CM

## 2019-03-11 DIAGNOSIS — O99212 Obesity complicating pregnancy, second trimester: Secondary | ICD-10-CM | POA: Diagnosis not present

## 2019-03-11 DIAGNOSIS — O2692 Pregnancy related conditions, unspecified, second trimester: Secondary | ICD-10-CM | POA: Diagnosis not present

## 2019-03-11 DIAGNOSIS — O09292 Supervision of pregnancy with other poor reproductive or obstetric history, second trimester: Secondary | ICD-10-CM | POA: Diagnosis not present

## 2019-03-11 DIAGNOSIS — Z3A37 37 weeks gestation of pregnancy: Secondary | ICD-10-CM

## 2019-03-11 DIAGNOSIS — O26842 Uterine size-date discrepancy, second trimester: Secondary | ICD-10-CM

## 2019-03-14 ENCOUNTER — Encounter: Payer: Self-pay | Admitting: Family Medicine

## 2019-03-14 ENCOUNTER — Telehealth (INDEPENDENT_AMBULATORY_CARE_PROVIDER_SITE_OTHER): Payer: Medicaid Other | Admitting: Obstetrics & Gynecology

## 2019-03-14 ENCOUNTER — Other Ambulatory Visit: Payer: Self-pay

## 2019-03-14 DIAGNOSIS — O0993 Supervision of high risk pregnancy, unspecified, third trimester: Secondary | ICD-10-CM | POA: Diagnosis not present

## 2019-03-14 DIAGNOSIS — O99213 Obesity complicating pregnancy, third trimester: Secondary | ICD-10-CM

## 2019-03-14 DIAGNOSIS — O0990 Supervision of high risk pregnancy, unspecified, unspecified trimester: Secondary | ICD-10-CM

## 2019-03-14 DIAGNOSIS — Z9119 Patient's noncompliance with other medical treatment and regimen: Secondary | ICD-10-CM | POA: Insufficient documentation

## 2019-03-14 DIAGNOSIS — Z91199 Patient's noncompliance with other medical treatment and regimen due to unspecified reason: Secondary | ICD-10-CM | POA: Insufficient documentation

## 2019-03-14 DIAGNOSIS — O99210 Obesity complicating pregnancy, unspecified trimester: Secondary | ICD-10-CM

## 2019-03-14 DIAGNOSIS — Z3A37 37 weeks gestation of pregnancy: Secondary | ICD-10-CM

## 2019-03-14 DIAGNOSIS — Z8632 Personal history of gestational diabetes: Secondary | ICD-10-CM

## 2019-03-14 NOTE — Progress Notes (Signed)
[place] VIRTUAL VIDEO VISIT ENCOUNTER NOTE  Provider location: [place] [facility] [employer] at [facility]   I connected with Ariana [place]ley on 03/14/19 at  2:15 PM EDT by Telephone (unable to use MyChart Video despite multiple attempts). Encounter at home and verified that I am speaking with the correct person using two identifiers.   I discussed the limitations, risks, security and privacy concerns of performing an evaluation and management service virtually and the availability of in person appointments. I also discussed with the patient that there may be a patient responsible charge related to this service. The patient expressed understanding and agreed to proceed. Subjective:  Ariana Bentley is a 28 y.o. G3P1011 at 33w3dbeing seen today for ongoing prenatal care.  She is currently monitored for the following issues for this high-risk pregnancy and has Pseudotumor cerebri; Supervision of high risk pregnancy, antepartum; History of gestational diabetes; Obesity in pregnancy; and Noncompliance on their problem list.  Patient reports back and hip pain which are disrupting sleep. Reports some contractions 4-5x daily and then relieve on their own. Reports some pressure.  Contractions: Irritability. Vag. Bleeding: None.  Movement: Present. Denies any leaking of fluid.   The following portions of the patient's history were reviewed and updated as appropriate: allergies, current medications, past family history, past medical history, past social history, past surgical history and problem list.   Objective:  There were no vitals filed for this visit.  Fetal Status:     Movement: Present     General:  Alert, oriented and cooperative. Patient is in no acute distress.  Respiratory: Speaking in full sentences   Mental Status: Normal mood and affect. Normal behavior. Normal judgment and thought content.  Rest of physical exam deferred due to type of encounter  Imaging: [place]Mfm Ob  Follow Up  Result Date: 03/12/2019 ----------------------------------------------------------------------  OBSTETRICS REPORT                       (Signed Final 03/12/2019 01:07 pm) ---------------------------------------------------------------------- Patient Info  ID #:       [id number]                         D.O.B.:  [birth year](28 yrs)  Name:       Ariana Bentley                    Visit Date: 03/11/2019 11:06 am ---------------------------------------------------------------------- Performed By  Performed By:     JGeorgie Chard       Ref. Address[address]                                                             [address]  Attending:        CSander Nephew     [facility]                    MD  Fetal Care  Referred By:      [facility] ---------------------------------------------------------------------- Orders   #  Description                          Code         Ordered By   1  [place] MFM OB FOLLOW UP                  41962.22     Ariana [place]ley  ----------------------------------------------------------------------   #  Order #                    Accession #                 Episode #   1  [id number]                  [id number]                  [id number]  ---------------------------------------------------------------------- Indications   Encounter for other antenatal screening        Z36.2   follow-up (Low Risk NIPS)   Medical complication of pregnancy              O26.90   (pseudotumor cerebri)   Obesity complicating pregnancy, second         O99.212   trimester (pregravid BMI 42)   Poor obstetric history: Previous gestational   O09.299   diabetes   Uterine size-date discrepancy, second          O26.842   trimester   Negative AFP   [redacted] weeks gestation of pregnancy                Z3A.37   ---------------------------------------------------------------------- Vital Signs  Weight (lb): 270                               Height:        5'6"  BMI:         43.57 ---------------------------------------------------------------------- Fetal Evaluation  Num Of Fetuses:          1  Fetal Heart Rate(bpm):   140  Cardiac Activity:        Observed  Presentation:            Cephalic  Placenta:                Posterior  P. Cord Insertion:       Previously Visualized  Amniotic Fluid  AFI FV:      Within normal limits  AFI Sum(cm)     %Tile       Largest Pocket(cm)  16.13           61          5.21  RUQ(cm)       RLQ(cm)       LUQ(cm)        LLQ(cm)  4.9           5.21          1.86  4.16 ---------------------------------------------------------------------- Biometry  BPD:      94.7  mm     G. Age:  38w 4d         94  %    CI:        75.12   %    70 - 86                                                          FL/HC:       19.7  %    20.8 - 22.6  HC:      346.6  mm     G. Age:  40w 1d         91  %    HC/AC:       1.06       0.92 - 1.05  AC:       327   mm     G. Age:  36w 4d         53  %    FL/BPD:      72.2  %    71 - 87  FL:       68.4  mm     G. Age:  35w 1d          9  %    FL/AC:       20.9  %    20 - 24  HUM:      62.8  mm     G. Age:  36w 3d         8  %  LV:        3.4  mm  Est. FW:    3045   gm   6 lb 11 oz      52  % ---------------------------------------------------------------------- OB History  Gravidity:    3         Term:   1        Prem:   0        SAB:   1  TOP:          0       Ectopic:  0        Living: 1 ---------------------------------------------------------------------- Gestational Age  LMP:           36w 1d        Date:  07/01/18                 EDD:   04/07/19  U/S Today:     37w 4d                                        EDD:   03/28/19  Best:          37w 0d     Det. ByLoman Chroman         EDD:   04/01/19                                      (08/04/18)  ---------------------------------------------------------------------- Anatomy  Cranium:  Appears normal         Aortic Arch:            Previously seen  Cavum:                 Previously seen        Ductal Arch:            Previously seen  Ventricles:            Appears normal         Diaphragm:              Appears normal  Choroid Plexus:        Previously seen        Stomach:                Appears normal, left                                                                        sided  Cerebellum:            Previously seen        Abdomen:                Previously seen  Posterior Fossa:       Previously seen        Abdominal Wall:         Previously seen  Nuchal Fold:           Previously seen        Cord Vessels:           Previously seen  Face:                  Orbits and profile     Kidneys:                Previously seen                         previously seen  Lips:                  Appears normal         Bladder:                Appears normal  Thoracic:              Appears normal         Spine:                  Previously seen  Heart:                 Previously seen        Upper Extremities:      Previously seen  RVOT:                  Previously seen        Lower Extremities:      Previously seen  LVOT:                  Previously seen  Other:  Heels and Nasal bone visualized previously. Technically difficult due          to  maternal habitus and fetal position. ---------------------------------------------------------------------- Impression  Normal interval growth.  BMI >40  Size consistent with dates ---------------------------------------------------------------------- Recommendations  Follow up as clinically indicated ----------------------------------------------------------------------               Sander Nephew, MD Electronically Signed Final Report   03/12/2019 01:07 pm ----------------------------------------------------------------------   Assessment and Plan:    Pregnancy: [id number] at 59w3d1. Supervision of high risk pregnancy, antepartum - pregnancy complicated by non-compliance, patient has yet to have OB panel or GTT done - [place] 8/14 as above with EFW 537%and cephalic - Glucose tolerance, 2 hours; Future - Comp Met (CMET); Future - patient to schedule in-person visit this week for labs, GBS and SVE as well as to discuss possible induction should this be indicated   2. History of gestational diabetes - GTT ordered again  3. Obesity in pregnancy  Term labor symptoms and general obstetric precautions including but not limited to vaginal bleeding, contractions, leaking of fluid and fetal movement were reviewed in detail with the patient. I discussed the assessment and treatment plan with the patient. The patient was provided an opportunity to ask questions and all were answered. The patient agreed with the plan and demonstrated an understanding of the instructions. The patient was advised to call back or seek an in-person office evaluation/[facility]. Please refer to After Visit Summary for other counseling recommendations.   I provided 20 minutes of face-to-face time during this encounter.  Return in about 2 days (around [facility]16/2019[facility]  Future Appointments  Date Time Provider [place] 03/21/2019  1:15 PM [facility]avonia Drafts [facility] 03/28/2019  1:15 PM STruett Mainland DO W[facility] [place] [facility]

## 2019-03-14 NOTE — Progress Notes (Signed)
Pt states is having pains in her hips & back.

## 2019-03-18 ENCOUNTER — Other Ambulatory Visit: Payer: Self-pay

## 2019-03-18 ENCOUNTER — Telehealth: Payer: Self-pay | Admitting: Obstetrics & Gynecology

## 2019-03-18 ENCOUNTER — Encounter (HOSPITAL_COMMUNITY): Payer: Self-pay | Admitting: *Deleted

## 2019-03-18 ENCOUNTER — Inpatient Hospital Stay (HOSPITAL_COMMUNITY)
Admission: AD | Admit: 2019-03-18 | Discharge: 2019-03-18 | Disposition: A | Discharge status: Home / Self Care | Payer: Medicaid Other | Attending: Obstetrics and Gynecology | Admitting: Obstetrics and Gynecology

## 2019-03-18 DIAGNOSIS — Z882 Allergy status to sulfonamides status: Secondary | ICD-10-CM | POA: Diagnosis not present

## 2019-03-18 DIAGNOSIS — Z9119 Patient's noncompliance with other medical treatment and regimen: Secondary | ICD-10-CM | POA: Insufficient documentation

## 2019-03-18 DIAGNOSIS — O26893 Other specified pregnancy related conditions, third trimester: Secondary | ICD-10-CM | POA: Diagnosis not present

## 2019-03-18 DIAGNOSIS — O479 False labor, unspecified: Secondary | ICD-10-CM

## 2019-03-18 DIAGNOSIS — N898 Other specified noninflammatory disorders of vagina: Secondary | ICD-10-CM | POA: Diagnosis present

## 2019-03-18 DIAGNOSIS — Z881 Allergy status to other antibiotic agents status: Secondary | ICD-10-CM | POA: Diagnosis not present

## 2019-03-18 DIAGNOSIS — Z3A38 38 weeks gestation of pregnancy: Secondary | ICD-10-CM | POA: Diagnosis not present

## 2019-03-18 DIAGNOSIS — Z3689 Encounter for other specified antenatal screening: Secondary | ICD-10-CM

## 2019-03-18 DIAGNOSIS — Z8249 Family history of ischemic heart disease and other diseases of the circulatory system: Secondary | ICD-10-CM | POA: Insufficient documentation

## 2019-03-18 DIAGNOSIS — R102 Pelvic and perineal pain: Secondary | ICD-10-CM | POA: Diagnosis not present

## 2019-03-18 LAB — COMPREHENSIVE METABOLIC PANEL
ALT: 16 U/L (ref 0–44)
AST: 21 U/L (ref 15–41)
Albumin: 3.3 g/dL — ABNORMAL LOW (ref 3.5–5.0)
Alkaline Phosphatase: 108 U/L (ref 38–126)
Anion gap: 12 (ref 5–15)
BUN: 5 mg/dL — ABNORMAL LOW (ref 6–20)
CO2: 16 mmol/L — ABNORMAL LOW (ref 22–32)
Calcium: 9.2 mg/dL (ref 8.9–10.3)
Chloride: 105 mmol/L (ref 98–111)
Creatinine, Ser: 0.73 mg/dL (ref 0.44–1.00)
GFR calc Af Amer: >60 mL/min (ref >60)
GFR calc non Af Amer: >60 mL/min (ref >60)
Glucose, Bld: 121 mg/dL — ABNORMAL HIGH (ref 70–99)
Potassium: 3.7 mmol/L (ref 3.5–5.1)
Sodium: 133 mmol/L — ABNORMAL LOW (ref 135–145)
Total Bilirubin: 0.5 mg/dL (ref 0.3–1.2)
Total Protein: 6.8 g/dL (ref 6.5–8.1)

## 2019-03-18 LAB — DIFFERENTIAL
Abs Immature Granulocytes: 0.05 10*3/uL (ref 0.00–0.07)
Basophils Absolute: 0 10*3/uL (ref 0.0–0.1)
Basophils Relative: 0 %
Eosinophils Absolute: 0.1 10*3/uL (ref 0.0–0.5)
Eosinophils Relative: 1 %
Immature Granulocytes: 1 %
Lymphocytes Relative: 31 %
Lymphs Abs: 2.6 10*3/uL (ref 0.7–4.0)
Monocytes Absolute: 0.5 10*3/uL (ref 0.1–1.0)
Monocytes Relative: 5 %
Neutro Abs: 5.2 10*3/uL (ref 1.7–7.7)
Neutrophils Relative %: 62 %

## 2019-03-18 LAB — CBC
HCT: 36.6 % (ref 36.0–46.0)
Hemoglobin: 11.6 g/dL — ABNORMAL LOW (ref 12.0–15.0)
MCH: 24.7 pg — ABNORMAL LOW (ref 26.0–34.0)
MCHC: 31.7 g/dL (ref 30.0–36.0)
MCV: 77.9 fL — ABNORMAL LOW (ref 80.0–100.0)
Platelets: 191 10*3/uL (ref 150–400)
RBC: 4.7 MIL/uL (ref 3.87–5.11)
RDW: 15.6 % — ABNORMAL HIGH (ref 11.5–15.5)
WBC: 8.3 10*3/uL (ref 4.0–10.5)
nRBC: 0 % (ref 0.0–0.2)

## 2019-03-18 LAB — HIV ANTIBODY (ROUTINE TESTING W REFLEX): HIV Screen 4th Generation wRfx: NONREACTIVE

## 2019-03-18 LAB — HEMOGLOBIN A1C
Hgb A1c MFr Bld: 5.3 % (ref 4.8–5.6)
Mean Plasma Glucose: 105.41 mg/dL

## 2019-03-18 LAB — POCT FERN TEST: POCT Fern Test: NEGATIVE

## 2019-03-18 LAB — HEPATITIS B SURFACE ANTIGEN: Hepatitis B Surface Ag: NEGATIVE

## 2019-03-18 LAB — RPR: RPR Ser Ql: NONREACTIVE

## 2019-03-18 NOTE — MAU Note (Addendum)
Pt reports to MAU c/o ctxs every 65min. Pt denies bleeding. +FM. Pt states she thinks her water broke on 03/16/19 clear fluid. Pt reports she was walking to the [place] and fluid came out. Pt states the fluid has been leaking since then. Pt reports a brown and white discharge. Pt reports a HA yesterday. Pt reports "eating a lot of sugar yesterday." pt states she has had a urge to push throughout the night and she didn't want to push at home alone.

## 2019-03-18 NOTE — Discharge Instructions (Signed)

## 2019-03-18 NOTE — Telephone Encounter (Signed)
Called the patient to inform of the upcoming mychart visit. The patient verbalized understanding. The patient stated she feels that she needs to come in as she visited the ER today. Stated she has started dilating and labs were completed at the visit. Informed the patient the physician will see the lab work. Encouraged to keep the appointment for Monday and if the doctor decides she should have an in person visit we will update.

## 2019-03-18 NOTE — MAU Provider Note (Signed)
Chief Complaint:  Contractions and Rupture of Membranes   First Provider Initiated Contact with Patient 03/18/19 0700    HPI: Ariana Bentley is a 28 y.o. G3P1011 at 31w0dho presents to maternity admissions reporting leaking of fluid two days ago.  Also having "need to push" this am.  Did have a BM with relief.  Irregular contractions.. She reports good fetal movement, denies vaginal bleeding, vaginal itching/burning, urinary symptoms, h/a, dizziness, n/v, diarrhea, constipation or fever/chills.   I was asked to do a rule out rupture.    Pt has been noncompliant with getting labs done, refuses glucola   Past Medical History: Past Medical History:  Diagnosis Date  . Anxiety   . Asthma   . Chlamydia   . Depression    doing ok  . Headache(784.0)   . Infection    UTI  . Pseudotumor cerebri   . Trichomonas     Past obstetric history: OB History  Gravida Para Term Preterm AB Living  '3 1 1 ' 0 1 1  SAB TAB Ectopic Multiple Live Births  1 0 0 0 1    # Outcome Date GA Lbr Len/2nd Weight Sex Delivery Anes PTL Lv  3 Current           2 SAB 2017          1 Term 12/20/11 [id number] 2700 g F Vag-Spont EPI  LIV    Past Surgical History: Past Surgical History:  Procedure Laterality Date  . LUMBAR PUNCTURE    . NO PAST SURGERIES    . WISDOM TOOTH EXTRACTION      Family History: Family History  Problem Relation Age of Onset  . Goiter Mother   . Hypertension Mother   . Ulcers Father   . Anesthesia problems Neg Hx   . Hearing loss Neg Hx     Social History: Social History   Tobacco Use  . Smoking status: Never Smoker  . Smokeless tobacco: Never Used  Substance Use Topics  . Alcohol use: Not Currently    Alcohol/week: 1.0 standard drinks    Types: 1 Glasses of wine per week  . Drug use: Not Currently    Types: Marijuana    Comment: Nov    Allergies:  Allergies  Allergen Reactions  . Shellfish Allergy Anaphylaxis  . Doxycycline Other (See Comments)    Causes  swelling behind the eyes; aggravates PTC condition    Meds:  Medications Prior to Admission  Medication Sig Dispense Refill Last Dose  . acetaminophen (TYLENOL) 500 MG tablet Take 2 tablets (1,000 mg total) by mouth every 6 (six) hours as needed. 30 tablet 0 Past Month at Unknown time  . Omega 3-6-9 Fatty Acids (OMEGA DHA PO) Take 1 tablet by mouth daily.   03/17/2019 at Unknown time  . Prenatal MV-Min-FA-Omega-3 (PRENATAL GUMMIES/DHA & FA PO) Take by mouth.   03/17/2019 at Unknown time  . albuterol (PROVENTIL HFA;VENTOLIN HFA) 108 (90 BASE) MCG/ACT inhaler Inhale 1-2 puffs into the lungs every 6 (six) hours as needed for wheezing. (Patient not taking: Reported on 03/14/2019) 1 Inhaler 0   . Elastic Bandages & Supports (COMFORT FIT MATERNITY SUPP LG) MISC 1 application by Does not apply route daily as needed. 1 each 0     I have reviewed patient's Past Medical Hx, Surgical Hx, Family Hx, Social Hx, medications and allergies.   ROS:  Review of Systems  Constitutional: Negative for chills and fever.  Respiratory: Negative for shortness of breath.  Gastrointestinal: Positive for abdominal pain. Negative for constipation, diarrhea, nausea and vomiting.  Genitourinary: Positive for pelvic pain and vaginal discharge. Negative for vaginal bleeding.   Other systems negative  Physical Exam   Patient Vitals for the past 24 hrs:  BP Temp Temp src Pulse Resp Weight  03/18/19 0649 119/65 99 F (37.2 C) Oral (!) 117 17 123.2 kg   Constitutional: Well-developed, well-nourished female in no acute distress.  Cardiovascular: normal rate and rhythm Respiratory: normal effort, clear to auscultation bilaterally GI: Abd soft, non-tender, gravid appropriate for gestational age.   No rebound or guarding. MS: Extremities nontender, no edema, normal ROM Neurologic: Alert and oriented x 4.  GU: Neg CVAT.  PELVIC EXAM: Cervix pink, visually closed, without lesion, scant white creamy discharge, vaginal walls  and external genitalia normal No Pooling No Ferning  Dilation: Fingertip Effacement (%): 50 Station: -3 Presentation: Vertex Exam by:: M.Abuk Selleck,CNM  FHT:  Baseline 135 , moderate variability, accelerations present, no decelerations Contractions:  Irregular    Labs: No results found for this or any previous visit (from the past 24 hour(s)).  Imaging:    MAU Course/MDM: I have ordered OB labs since she has not had them done and is 38 wks.  Will also get GBS while she is here NST reviewed, reassuring No evidence of ruptured membranes Pt wants to wait the whole hour to get cervix rechecked.  Is not in active labor  Treatments in MAU included EFM.    Assessment: Single IUP at 20w0dIrregular contractions Vaginal discharge in pregnancy, no evidence for ROM Noncompliance with some OB care  Plan: Discharge home after recheck of cervix if unchanged OB panel, CMET, HgbA1C, and GBS done today Labor precautions and fetal kick counts Follow up in Office for prenatal visits and recheck  Encouraged to return here or to other Urgent Care/ED if she develops worsening of symptoms, increase in pain, fever, or other concerning symptoms.   Pt stable at time of discharge.  MHansel FeinsteinCNM, MSN Certified Nurse-Midwife 03/18/2019 7:51 AM

## 2019-03-19 LAB — RUBELLA SCREEN: Rubella: 1.69 index (ref >0.99)

## 2019-03-20 LAB — CULTURE, BETA STREP (GROUP B ONLY)

## 2019-03-21 ENCOUNTER — Encounter: Payer: Self-pay | Admitting: Obstetrics & Gynecology

## 2019-03-21 ENCOUNTER — Telehealth (INDEPENDENT_AMBULATORY_CARE_PROVIDER_SITE_OTHER): Payer: Medicaid Other | Admitting: Obstetrics & Gynecology

## 2019-03-21 ENCOUNTER — Other Ambulatory Visit: Payer: Self-pay

## 2019-03-21 ENCOUNTER — Telehealth: Payer: Self-pay | Admitting: Family Medicine

## 2019-03-21 VITALS — BP 100/70 | HR 108

## 2019-03-21 DIAGNOSIS — Z9119 Patient's noncompliance with other medical treatment and regimen: Secondary | ICD-10-CM

## 2019-03-21 DIAGNOSIS — Z8632 Personal history of gestational diabetes: Secondary | ICD-10-CM

## 2019-03-21 DIAGNOSIS — O0990 Supervision of high risk pregnancy, unspecified, unspecified trimester: Secondary | ICD-10-CM

## 2019-03-21 DIAGNOSIS — G932 Benign intracranial hypertension: Secondary | ICD-10-CM

## 2019-03-21 DIAGNOSIS — O0993 Supervision of high risk pregnancy, unspecified, third trimester: Secondary | ICD-10-CM

## 2019-03-21 DIAGNOSIS — Z91199 Patient's noncompliance with other medical treatment and regimen due to unspecified reason: Secondary | ICD-10-CM

## 2019-03-21 DIAGNOSIS — Z3A38 38 weeks gestation of pregnancy: Secondary | ICD-10-CM

## 2019-03-21 DIAGNOSIS — O99213 Obesity complicating pregnancy, third trimester: Secondary | ICD-10-CM

## 2019-03-21 DIAGNOSIS — O99210 Obesity complicating pregnancy, unspecified trimester: Secondary | ICD-10-CM

## 2019-03-21 NOTE — Progress Notes (Signed)
1:13P- Called Pt for My Chart visit, no answer, left VM that will retry in 10 minutes.   1:23p- -Pt. Answered.

## 2019-03-21 NOTE — Telephone Encounter (Signed)
The patient requested a female provider preferbly Dr. Harolyn Rutherford however there are no more available appointments for the upcoming week with Dr. Nehemiah Settle. The schedule will remain as is however if there is a cancellation the patient we be rescheduled.

## 2019-03-21 NOTE — Progress Notes (Signed)
TELEHEALTH OBSTETRICS PRENATAL VIRTUAL VIDEO VISIT ENCOUNTER NOTE  [facility]   I connected with Ariana CloudNajwa Bentley on 03/21/19 at  1:15 PM EDT by MyChart Video Encounter at home and verified that I am speaking with the correct person using two identifiers.   I discussed the limitations, risks, security and privacy concerns of performing an evaluation and management service virtually and the availability of in person appointments. I also discussed with the patient that there may be a patient responsible charge related to this service. The patient expressed understanding and agreed to proceed. Subjective:  Ariana Cloudajwa Bentley is a 28 y.o. G3P1011 at 4122w3d being seen today for ongoing prenatal care.  She is currently monitored for the following issues for this high-risk pregnancy and has Pseudotumor cerebri; Supervision of high risk pregnancy, antepartum; History of gestational diabetes; Obesity in pregnancy; and Noncompliance on their problem list.  Patient reports occasional contractions.  Contractions: Irritability. Vag. Bleeding: None.  Movement: Present. Denies any leaking of fluid.   The following portions of the patient's history were reviewed and updated as appropriate: allergies, current medications, past family history, past medical history, past social history, past surgical history and problem list.   Objective:   Vitals:   03/21/19 1328  BP: 100/70  Pulse: (!) 108    Fetal Status:     Movement: Present     General:  Alert, oriented and cooperative. Patient is in no acute distress.  Respiratory: Normal respiratory effort, no problems with respiration noted  Mental Status: Normal mood and affect. Normal behavior. Normal judgment and thought content.  Rest of physical exam deferred due to type of encounter  Imaging: [place] Mfm Ob Follow Up  Result Date: 03/12/2019 ----------------------------------------------------------------------   OBSTETRICS REPORT                       (Signed Final 03/12/2019 01:07 pm) ---------------------------------------------------------------------- Patient Info  ID #:       [id number]                          D.O.B.:  Apr 11, 1991 (28 yrs)  Name:       Ariana Bentley                     Visit Date: 03/11/2019 11:06 am ---------------------------------------------------------------------- Performed By  Performed By:     Sandi MealyJovancia Adrien        Ref. Address:      [address]                    [place]                                                              [address]  Attending:        Lin Landsmanorenthian Booker      [facility]  Referred By:      [facility] ---------------------------------------------------------------------- Orders   #  Description                          Code         Ordered By   1  [place] MFM OB FOLLOW UP                  01027.25     Phill Myron  ----------------------------------------------------------------------   #  Order #                    Accession #                 Episode #   1  [id number]                  [id number]                  [id number]  ---------------------------------------------------------------------- Indications   Encounter for other antenatal screening        Z36.2   follow-up (Low Risk NIPS)   Medical complication of pregnancy              O26.90   (pseudotumor cerebri)   Obesity complicating pregnancy, second         O99.212   trimester (pregravid BMI 42)   Poor obstetric history: Previous gestational   O09.299   diabetes   Uterine size-date discrepancy, second          O26.842   trimester   Negative AFP   [redacted] weeks gestation of pregnancy                Z3A.37  ---------------------------------------------------------------------- Vital Signs  Weight (lb): 270                               Height:        5'6"  BMI:         43.57  ---------------------------------------------------------------------- Fetal Evaluation  Num Of Fetuses:          1  Fetal Heart Rate(bpm):   140  Cardiac Activity:        Observed  Presentation:            Cephalic  Placenta:                Posterior  P. Cord Insertion:       Previously Visualized  Amniotic Fluid  AFI FV:      Within normal limits  AFI Sum(cm)     %Tile       Largest Pocket(cm)  16.13           61          5.21  RUQ(cm)       RLQ(cm)       LUQ(cm)        LLQ(cm)  4.9           5.21          1.86  4.16 ---------------------------------------------------------------------- Biometry  BPD:      94.7  mm     G. Age:  38w 4d         94  %    CI:        75.12   %    70 - 86                                                          FL/HC:       19.7  %    20.8 - 22.6  HC:      346.6  mm     G. Age:  40w 1d         91  %    HC/AC:       1.06       0.92 - 1.05  AC:       327   mm     G. Age:  36w 4d         53  %    FL/BPD:      72.2  %    71 - 87  FL:       68.4  mm     G. Age:  35w 1d          9  %    FL/AC:       20.9  %    20 - 24  HUM:      62.8  mm     G. Age:  36w 3d         59  %  LV:        3.4  mm  Est. FW:    3045   gm   6 lb 11 oz      52  % ---------------------------------------------------------------------- OB History  Gravidity:    3         Term:   1        Prem:   0        SAB:   1  TOP:          0       Ectopic:  0        Living: 1 ---------------------------------------------------------------------- Gestational Age  LMP:           36w 1d        Date:  07/01/18                 EDD:   04/07/19  U/S Today:     37w 4d                                        EDD:   03/28/19  Best:          37w 0d     Det. ByMarcella Dubs         EDD:   04/01/19                                      (08/04/18) ---------------------------------------------------------------------- Anatomy  Cranium:  Appears normal         Aortic Arch:            Previously seen  Cavum:                  Previously seen        Ductal Arch:            Previously seen  Ventricles:            Appears normal         Diaphragm:              Appears normal  Choroid Plexus:        Previously seen        Stomach:                Appears normal, left                                                                        sided  Cerebellum:            Previously seen        Abdomen:                Previously seen  Posterior Fossa:       Previously seen        Abdominal Wall:         Previously seen  Nuchal Fold:           Previously seen        Cord Vessels:           Previously seen  Face:                  Orbits and profile     Kidneys:                Previously seen                         previously seen  Lips:                  Appears normal         Bladder:                Appears normal  Thoracic:              Appears normal         Spine:                  Previously seen  Heart:                 Previously seen        Upper Extremities:      Previously seen  RVOT:                  Previously seen        Lower Extremities:      Previously seen  LVOT:                  Previously seen  Other:  Heels and Nasal bone visualized previously. Technically difficult due  to maternal habitus and fetal position. ---------------------------------------------------------------------- Impression  Normal interval growth.  BMI >40  Size consistent with dates ---------------------------------------------------------------------- Recommendations  Follow up as clinically indicated ----------------------------------------------------------------------               Lin Landsmanorenthian Booker, MD Electronically Signed Final Report   03/12/2019 01:07 pm ----------------------------------------------------------------------   Assessment and Plan:  Pregnancy: G3P1011 at 1652w3d 1. Supervision of high risk pregnancy, antepartum +FM Seen in the MAU 03/18/2019    2. Pseudotumor cerebri No new types of HA.  Occ HA  3. Obesity in pregnancy  03/11/2019  Est. FW:    3045   gm   6 lb 11 oz      52  % 4. Noncompliance Has some labs done in MAU I still do not see T&S.   5. History of gestational diabetes Pt declined test. Has a HgbA1c that was WNL  6. Pt considering BTL Needs Tubal papers signed at next visit.   Term labor symptoms and general obstetric precautions including but not limited to vaginal bleeding, contractions, leaking of fluid and fetal movement were reviewed in detail with the patient. I discussed the assessment and treatment plan with the patient. The patient was provided an opportunity to ask questions and all were answered. The patient agreed with the plan and demonstrated an understanding of the instructions. The patient was advised to call back or seek an in-person office evaluation/[facility] for any urgent or concerning symptoms. Please refer to After Visit Summary for other counseling recommendations.   I provided 12 minutes of face-to-face time during this encounter.  Return in about 2 weeks (around 04/04/2019) for in person.  Future Appointments  Date Time Provider Department Center  03/28/2019  1:15 PM Noralee CharsStinson, Jacob J, DO [facility]  04/06/2019  3:15 PM Allie Bossierove, Myra C, MD [facility]    Willodean Rosenthalarolyn Harraway-Smith, MD [facility], [facility]

## 2019-03-25 ENCOUNTER — Telehealth: Payer: Self-pay | Admitting: Family Medicine

## 2019-03-25 NOTE — Telephone Encounter (Signed)
Spoke to patient about her appointment on 8/31 @ 1:15. Patient instructed to wear a face mask for the entire appointment and no visitors are allowed with her during the visit. Patient screened for covid symptoms and denied having any

## 2019-03-27 ENCOUNTER — Inpatient Hospital Stay (HOSPITAL_COMMUNITY): Payer: Medicaid Other | Admitting: Anesthesiology

## 2019-03-27 ENCOUNTER — Other Ambulatory Visit: Payer: Self-pay

## 2019-03-27 ENCOUNTER — Inpatient Hospital Stay (HOSPITAL_COMMUNITY)
Admission: AD | Admit: 2019-03-27 | Discharge: 2019-03-29 | DRG: 807 | Disposition: A | Discharge status: Home / Self Care | Payer: Medicaid Other | Attending: Obstetrics & Gynecology | Admitting: Obstetrics & Gynecology

## 2019-03-27 ENCOUNTER — Encounter (HOSPITAL_COMMUNITY): Payer: Self-pay | Admitting: *Deleted

## 2019-03-27 DIAGNOSIS — G932 Benign intracranial hypertension: Secondary | ICD-10-CM | POA: Diagnosis present

## 2019-03-27 DIAGNOSIS — Z3A39 39 weeks gestation of pregnancy: Secondary | ICD-10-CM

## 2019-03-27 DIAGNOSIS — Z8632 Personal history of gestational diabetes: Secondary | ICD-10-CM | POA: Diagnosis not present

## 2019-03-27 DIAGNOSIS — E669 Obesity, unspecified: Secondary | ICD-10-CM | POA: Diagnosis present

## 2019-03-27 DIAGNOSIS — O99354 Diseases of the nervous system complicating childbirth: Principal | ICD-10-CM | POA: Diagnosis present

## 2019-03-27 DIAGNOSIS — O9089 Other complications of the puerperium, not elsewhere classified: Secondary | ICD-10-CM | POA: Diagnosis not present

## 2019-03-27 DIAGNOSIS — O99210 Obesity complicating pregnancy, unspecified trimester: Secondary | ICD-10-CM | POA: Diagnosis present

## 2019-03-27 DIAGNOSIS — O26893 Other specified pregnancy related conditions, third trimester: Secondary | ICD-10-CM | POA: Diagnosis present

## 2019-03-27 DIAGNOSIS — Y92230 Patient room in hospital as the place of occurrence of the external cause: Secondary | ICD-10-CM | POA: Diagnosis present

## 2019-03-27 DIAGNOSIS — Z20828 Contact with and (suspected) exposure to other viral communicable diseases: Secondary | ICD-10-CM | POA: Diagnosis present

## 2019-03-27 DIAGNOSIS — W1839XA Other fall on same level, initial encounter: Secondary | ICD-10-CM | POA: Diagnosis not present

## 2019-03-27 DIAGNOSIS — O99214 Obesity complicating childbirth: Secondary | ICD-10-CM | POA: Diagnosis present

## 2019-03-27 DIAGNOSIS — M7918 Myalgia, other site: Secondary | ICD-10-CM | POA: Diagnosis not present

## 2019-03-27 DIAGNOSIS — W19XXXA Unspecified fall, initial encounter: Secondary | ICD-10-CM

## 2019-03-27 LAB — CBC
HCT: 35.4 % — ABNORMAL LOW (ref 36.0–46.0)
Hemoglobin: 11.4 g/dL — ABNORMAL LOW (ref 12.0–15.0)
MCH: 25.1 pg — ABNORMAL LOW (ref 26.0–34.0)
MCHC: 32.2 g/dL (ref 30.0–36.0)
MCV: 77.8 fL — ABNORMAL LOW (ref 80.0–100.0)
Platelets: 175 10*3/uL (ref 150–400)
RBC: 4.55 MIL/uL (ref 3.87–5.11)
RDW: 15.9 % — ABNORMAL HIGH (ref 11.5–15.5)
WBC: 7.5 10*3/uL (ref 4.0–10.5)
nRBC: 0 % (ref 0.0–0.2)

## 2019-03-27 LAB — ABO/RH: ABO/RH(D): O POS

## 2019-03-27 LAB — TYPE AND SCREEN
ABO/RH(D): O POS
Antibody Screen: NEGATIVE

## 2019-03-27 LAB — SARS CORONAVIRUS 2 BY RT PCR (HOSPITAL ORDER, PERFORMED IN ~~LOC~~ HOSPITAL LAB): SARS Coronavirus 2: NEGATIVE

## 2019-03-27 LAB — GLUCOSE, CAPILLARY: Glucose-Capillary: 86 mg/dL (ref 70–99)

## 2019-03-27 MED ORDER — LIDOCAINE HCL (PF) 1 % IJ SOLN
INTRAMUSCULAR | Status: DC | PRN
  Administered 2019-03-27 (×2): 5 mL via EPIDURAL

## 2019-03-27 MED ORDER — ONDANSETRON HCL 4 MG/2ML IJ SOLN: 4.0000 mg | Freq: Four times a day (QID) | INTRAMUSCULAR | Status: DC | PRN

## 2019-03-27 MED ORDER — EPHEDRINE 5 MG/ML INJ: 10.0000 mg | INTRAVENOUS | Status: DC | PRN

## 2019-03-27 MED ORDER — DIPHENHYDRAMINE HCL 50 MG/ML IJ SOLN: 12.5000 mg | INTRAMUSCULAR | Status: DC | PRN

## 2019-03-27 MED ORDER — LACTATED RINGERS IV SOLN: 500.0000 mL | INTRAVENOUS | Status: DC | PRN

## 2019-03-27 MED ORDER — SOD CITRATE-CITRIC ACID 500-334 MG/5ML PO SOLN: 30.0000 mL | ORAL | Status: DC | PRN

## 2019-03-27 MED ORDER — OXYTOCIN BOLUS FROM INFUSION
500.0000 mL | Freq: Once | INTRAVENOUS | Status: AC
  Administered 2019-03-27: 23:00:00 500 mL via INTRAVENOUS

## 2019-03-27 MED ORDER — LIDOCAINE HCL (PF) 1 % IJ SOLN
30.0000 mL | INTRAMUSCULAR | Status: DC | PRN
  Filled 2019-03-27: qty 30

## 2019-03-27 MED ORDER — PHENYLEPHRINE 40 MCG/ML (10ML) SYRINGE FOR IV PUSH (FOR BLOOD PRESSURE SUPPORT)
80.0000 ug | PREFILLED_SYRINGE | INTRAVENOUS | Status: DC | PRN
  Filled 2019-03-27: qty 10

## 2019-03-27 MED ORDER — OXYTOCIN 40 UNITS IN NORMAL SALINE INFUSION - SIMPLE MED
2.5000 [IU]/h | INTRAVENOUS | Status: DC
  Administered 2019-03-27: 23:00:00 2.5 [IU]/h via INTRAVENOUS
  Filled 2019-03-27: qty 1000

## 2019-03-27 MED ORDER — SODIUM CHLORIDE (PF) 0.9 % IJ SOLN
INTRAMUSCULAR | Status: DC | PRN
  Administered 2019-03-27: 12 mL/h via EPIDURAL

## 2019-03-27 MED ORDER — PHENYLEPHRINE 40 MCG/ML (10ML) SYRINGE FOR IV PUSH (FOR BLOOD PRESSURE SUPPORT): 80.0000 ug | PREFILLED_SYRINGE | INTRAVENOUS | Status: DC | PRN

## 2019-03-27 MED ORDER — ACETAMINOPHEN 325 MG PO TABS: 650.0000 mg | ORAL_TABLET | ORAL | Status: DC | PRN

## 2019-03-27 MED ORDER — FENTANYL-BUPIVACAINE-NACL 0.5-0.125-0.9 MG/250ML-% EP SOLN: 12.0000 mL/h | EPIDURAL | Status: DC | PRN

## 2019-03-27 MED ORDER — FENTANYL CITRATE (PF) 100 MCG/2ML IJ SOLN
100.0000 ug | INTRAMUSCULAR | Status: DC | PRN
  Administered 2019-03-27: 13:00:00 100 ug via INTRAVENOUS
  Filled 2019-03-27: qty 2

## 2019-03-27 MED ORDER — OXYCODONE-ACETAMINOPHEN 5-325 MG PO TABS: 2.0000 | ORAL_TABLET | ORAL | Status: DC | PRN

## 2019-03-27 MED ORDER — OXYCODONE-ACETAMINOPHEN 5-325 MG PO TABS: 1.0000 | ORAL_TABLET | ORAL | Status: DC | PRN

## 2019-03-27 MED ORDER — FLEET ENEMA 7-19 GM/118ML RE ENEM: 1.0000 | ENEMA | RECTAL | Status: DC | PRN

## 2019-03-27 MED ORDER — LACTATED RINGERS IV SOLN
INTRAVENOUS | Status: DC
  Administered 2019-03-27 (×2): via INTRAVENOUS

## 2019-03-27 MED ORDER — LACTATED RINGERS IV SOLN
500.0000 mL | Freq: Once | INTRAVENOUS | Status: AC
  Administered 2019-03-27: 500 mL via INTRAVENOUS

## 2019-03-27 MED ORDER — FENTANYL-BUPIVACAINE-NACL 0.5-0.125-0.9 MG/250ML-% EP SOLN
12.0000 mL/h | EPIDURAL | Status: DC | PRN
  Filled 2019-03-27: qty 250

## 2019-03-27 NOTE — Anesthesia Preprocedure Evaluation (Signed)
Anesthesia Evaluation  Patient identified by MRN, date of birth, ID band Patient awake    Reviewed: Allergy & Precautions, H&P , NPO status , Patient's Chart, lab work & pertinent test results  History of Anesthesia Complications Negative for: history of anesthetic complications  Airway Mallampati: II  TM Distance: >3 FB Neck ROM: full    Dental no notable dental hx. (+) Teeth Intact   Pulmonary asthma ,    Pulmonary exam normal breath sounds clear to auscultation       Cardiovascular negative cardio ROS Normal cardiovascular exam Rhythm:regular Rate:Normal     Neuro/Psych Anxiety Depression Pseudotumor cerebri negative neurological ROS     GI/Hepatic negative GI ROS, Neg liver ROS,   Endo/Other  negative endocrine ROS  Renal/GU negative Renal ROS  negative genitourinary   Musculoskeletal   Abdominal   Peds  Hematology negative hematology ROS (+)   Anesthesia Other Findings   Reproductive/Obstetrics (+) Pregnancy                             Anesthesia Physical Anesthesia Plan  ASA: II  Anesthesia Plan: Epidural   Post-op Pain Management:    Induction:   PONV Risk Score and Plan:   Airway Management Planned:   Additional Equipment:   Intra-op Plan:   Post-operative Plan:   Informed Consent: I have reviewed the patients History and Physical, chart, labs and discussed the procedure including the risks, benefits and alternatives for the proposed anesthesia with the patient or authorized representative who has indicated his/her understanding and acceptance.       Plan Discussed with:   Anesthesia Plan Comments:         Anesthesia Quick Evaluation

## 2019-03-27 NOTE — Progress Notes (Signed)
Labor Progress Note Laney Louderback is a 28 y.o. G3P1011 at [redacted]w[redacted]d presented for SOL.  S: Feeling very uncomfortable and would like water broken. Feeling urge to push.   O:  BP 116/64   Pulse 98   Temp 98.6 F (37 C) (Oral)   Resp 18   Ht 5\' 7"  (1.702 m)   Wt 124.7 kg   LMP 07/01/2018   SpO2 100%   BMI 43.07 kg/m  EFM: 140/moderate var/accels, no decels   CVE: Dilation: 8.5 Effacement (%): 90, 100 Cervical Position: Middle Station: -1, -2 Presentation: Vertex Exam by:: Jeannette Corpus, RN    [name]: 28 y.o. G3P1011 [redacted]w[redacted]d here for SOL.  #Labor: Progressing well. AROM with clear/bloody fluid at this check. Patient 9.5/0/-1. Anticipate SVD.  #Pain: Epidural in place #FWB: Cat I currently  #GBS negative #Hx of GDM: random CBG 86. No further assessment. No scheduled CBGs.  [name], MD 8:46 PM

## 2019-03-27 NOTE — MAU Note (Signed)
Ariana Bentley is a 28 y.o. at [redacted]w[redacted]d here in MAU reporting: pt reports she is in labor. Started having contractions this AM, they are coming back to back and are intense. No bleeding, states she has been leaking fluid. The fluid is yellow and is thick and watery. +FM. Last cervical exam she was 0.5/50. states she knows she has dilated more.   Onset of complaint: today  Pain score: 10/10  Vitals:   03/27/19 0958  BP: 115/70  Pulse: (!) 103  Resp: 18  Temp: 99.4 F (37.4 C)  SpO2: 100%     FHT: +FM  Lab orders placed from triage: none

## 2019-03-27 NOTE — Progress Notes (Signed)
Assisting with admission at this time. Pt had been instructed in MAU per Margreta Journey and Luellen Pucker to keep her mask on while staff where in the room. Pt keeps taking it off and making comments or just covers her mouth. RN reinforced and encouraged reasons why it's important. Doula at bedside with proper PPE on.   When asking questions both RNs noticed pts sticky note stating to ask for Code Word. Varified with patient that it was ok to proceed with questions with Doula in the room. "That the Code Word is for when only people call to get information". Explained to her that we do not give any personal information out due to HIPPA,however if someone should call and ask to speak with [facility], the operator would transfer the caller to her room to speak with her directly. She was given verbal information and the option to discuss further with security to become a security patient. She declined at this time.   Further into the admission, it was noticed that the patient had water on her bedside side table within her reach. She asked her doula to give her the water so she could drink it. Soon after she ask to be moved up in bed. At this time the patient does not have an epidural and is able to assist with movement. It was noted that the doula was going over to the bed and the patient was reaching for the doulas arm and shoulder. At that time I asked the patient what position she would like assistance to move in. She said she wanted to sit up more and move back. That's when I encouraged her to bend her knees, for her to grab the side rails and leaned her HOB back just a little. That her Doula and I would assist pulling her up with the bed pads while she pushed with her knees to get her in a more comfortable position. The patient continued to grab onto the doulas arm pulling on her stating " that's what I'm paying her for". I explained to her that "team work makes the dream work" and that we were all here to help her,  support her, encourage her, however pulling on the staff can cause injury.

## 2019-03-27 NOTE — Anesthesia Procedure Notes (Signed)
Epidural Patient location during procedure: OB  Staffing Anesthesiologist: Montez Hageman, MD Performed: anesthesiologist   Preanesthetic Checklist Completed: patient identified, site marked, surgical consent, pre-op evaluation, timeout performed, IV checked, risks and benefits discussed and monitors and equipment checked  Epidural Patient position: sitting Prep: DuraPrep Patient monitoring: heart rate, continuous pulse ox and blood pressure Approach: midline Location: L3-L4 Injection technique: LOR saline  Needle:  Needle type: Tuohy  Needle gauge: 17 G Needle length: 9 cm and 9 Needle insertion depth: 11 cm Catheter type: closed end flexible Catheter size: 20 Guage Catheter at skin depth: 14 cm Test dose: negative  Assessment Events: blood not aspirated, injection not painful, no injection resistance, negative IV test and no paresthesia  Additional Notes Patient identified. Risks/Benefits/Options discussed with patient including but not limited to bleeding, infection, nerve damage, paralysis, failed block, incomplete pain control, headache, blood pressure changes, nausea, vomiting, reactions to medication both or allergic, itching and postpartum back pain. Confirmed with bedside nurse the patient's most recent platelet count. Confirmed with patient that they are not currently taking any anticoagulation, have any bleeding history or any family history of bleeding disorders. Patient expressed understanding and wished to proceed. All questions were answered. Sterile technique was used throughout the entire procedure. Please see nursing notes for vital signs. Test dose was given through epidural needle and negative prior to continuing to dose epidural or start infusion. Warning signs of high block given to the patient including shortness of breath, tingling/numbness in hands, complete motor block, or any concerning symptoms with instructions to call for help. Patient was given  instructions on fall risk and not to get out of bed. All questions and concerns addressed with instructions to call with any issues.

## 2019-03-27 NOTE — Progress Notes (Signed)
Labor Progress Note Ariana Bentley is a 28 y.o. G3P1011 at [redacted]w[redacted]d presented for SOL.  S: Feeling very uncomfortable. Wants to push.  O:  BP 116/64   Pulse 98   Temp 98.6 F (37 C) (Oral)   Resp 18   Ht 5\' 7"  (1.702 m)   Wt 124.7 kg   LMP 07/01/2018   SpO2 100%   BMI 43.07 kg/m  EFM: 140/moderate var/accels, decels present  CVE: Dilation: 10 Dilation Complete Date: 03/27/19 Dilation Complete Time: 2110 Effacement (%): 90, 100 Cervical Position: Middle Station: -1, -2 Presentation: Vertex Exam by:: Dr. Marice Potter   [name]: 28 y.o. H7D4287 [redacted]w[redacted]d here for SOL.  #Labor: Attempted pushing with poor maternal effort. Currently +1 station. FSE placed. Patient with decels with contractions that recover with position changes. Will ask Dr. Roselie Awkward to evaluate due to recurrent lates.  #Pain: Epidural in place #FWB: Cat II #GBS negative #Hx of GDM: random CBG 86. No further assessment. No scheduled CBGs.  [name], MD 10:14 PM

## 2019-03-27 NOTE — Progress Notes (Signed)
Labor Progress Note Ariana Bentley is a 28 y.o. G3P1011 at [redacted]w[redacted]d presented for SOL.  S: Much more comfortable now that epidural is in place.    O:  BP 130/71   Pulse 81   Temp 98.8 F (37.1 C) (Oral)   Resp 18   Ht 5\' 7"  (1.702 m)   Wt 124.7 kg   LMP 07/01/2018   SpO2 100%   BMI 43.07 kg/m  EFM: 140/moderate var/accels, two variable decels @1600  but resolved with repositioning.  CVE: Dilation: 4.5 Effacement (%): 90 Station: -3 Presentation: Vertex Exam by:: Ariana Bentley, RNC   [name]: 28 y.o. G3P1011 [redacted]w[redacted]d here for SOL.  #Labor: Progressing without augmentation. Forebag present. Consider AROM at next cervical exam. No augmentation at this time. #Pain: Epidural in place #FWB: 2 variable decels at 1600 but otherwise reassuring category I strip. #GBS negative #Hx of GDM: random CBG 86. No further assessment. No schedule CBGs.  Matilde Haymaker, MD 4:48 PM

## 2019-03-27 NOTE — H&P (Addendum)
OBSTETRIC ADMISSION HISTORY AND PHYSICAL  Ariana Bentley is a 28 y.o. female G3P1011 with IUP at 32w2dby early U/S presenting for SOL. She reports +FMs, No LOF, no VB, no blurry vision, headaches or peripheral edema, and RUQ pain.  She plans on Breast feeding. She request BTL (outpatient) for birth control. She received her prenatal care at [facility] U/S at 5w5 --->  Estimated Date of Delivery: 04/01/19  Sono:  '@[redacted]w[redacted]d' , CWD, normal anatomy, Cephalic presentation, vertex lie, 3045g, 52% EFW  Prenatal History/Complications: Pseudotumor cerebri Obesity Hx of gestational diabetes  Past Medical History: Past Medical History:  Diagnosis Date  . Anxiety   . Asthma   . Chlamydia   . Depression    doing ok  . Headache(784.0)   . Infection    UTI  . Pseudotumor cerebri   . Trichomonas     Past Surgical History: Past Surgical History:  Procedure Laterality Date  . LUMBAR PUNCTURE    . NO PAST SURGERIES    . WISDOM TOOTH EXTRACTION      Obstetrical History: OB History    Gravida  3   Para  1   Term  1   Preterm  0   AB  1   Living  1     SAB  1   TAB  0   Ectopic  0   Multiple  0   Live Births  1           Social History: Social History   Socioeconomic History  . Marital status: Significant Other    Spouse name: Not on file  . Number of children: Not on file  . Years of education: Not on file  . Highest education level: Not on file  Occupational History  . Not on file  Social Needs  . Financial resource strain: Not on file  . Food insecurity    Worry: Never true    Inability: Sometimes true  . Transportation needs    Medical: No    Non-medical: No  Tobacco Use  . Smoking status: Never Smoker  . Smokeless tobacco: Never Used  Substance and Sexual Activity  . Alcohol use: Not Currently    Alcohol/week: 1.0 standard drinks    Types: 1 Glasses of wine per week  . Drug use: Not Currently    Types: Marijuana    Comment: Nov  . Sexual  activity: Not Currently    Birth control/protection: None  Lifestyle  . Physical activity    Days per week: Not on file    Minutes per session: Not on file  . Stress: Not on file  Relationships  . Social [place] phone: Not on file    Gets together: Not on file    Attends religious service: Not on file    Active member of club or organization: Not on file    Attends meetings of clubs or organizations: Not on file    Relationship status: Not on file  Other Topics Concern  . Not on file  Social History Narrative  . Not on file    Family History: Family History  Problem Relation Age of Onset  . Goiter Mother   . Hypertension Mother   . Ulcers Father   . Anesthesia problems Neg Hx   . Hearing loss Neg Hx     Allergies: Allergies  Allergen Reactions  . Shellfish Allergy Anaphylaxis  . Doxycycline Other (See Comments)  Causes swelling behind the eyes; aggravates PTC condition    Medications Prior to Admission  Medication Sig Dispense Refill Last Dose  . acetaminophen (TYLENOL) 500 MG tablet Take 2 tablets (1,000 mg total) by mouth every 6 (six) hours as needed. 30 tablet 0   . Elastic Bandages & Supports (COMFORT FIT MATERNITY SUPP LG) MISC 1 application by Does not apply route daily as needed. 1 each 0   . Omega 3-6-9 Fatty Acids (OMEGA DHA PO) Take 1 tablet by mouth daily.     . Prenatal MV-Min-FA-Omega-3 (PRENATAL GUMMIES/DHA & FA PO) Take by mouth.        Review of Systems   All systems reviewed and negative except as stated in HPI  Blood pressure (!) 113/57, pulse 88, temperature 97.9 F (36.6 C), temperature source Axillary, resp. rate 18, height '5\' 7"'  (1.702 m), weight 124.7 kg, last menstrual period 07/01/2018, SpO2 100 %. General appearance: alert, cooperative and appears stated age Lungs: clear to auscultation bilaterally Heart: regular rate and rhythm Abdomen: soft, non-tender; bowel sounds normal Fetal monitoringBaseline: 145 bpm,  Variability: Good {> 6 bpm) and Accelerations: Reactive Uterine activityFrequency: Every 10 minutes Dilation: 4 Effacement (%): 50 Station: -3 Exam by:: Marlou Porch CNM   Prenatal labs: ABO, Rh: --/--/PENDING (08/30 1155) Antibody: PENDING (08/30 1155) Rubella: 1.69 (08/21 0833) RPR: Non Reactive (08/21 0833)  HBsAg: Negative (08/21 8811)  HIV: Non Reactive (08/21 0833)  GBS:    1 hr Glucola declined (ordered multiple times) Genetic screening  Low risk Anatomy US normal  Prenatal Transfer Tool  Maternal Diabetes: Unsure, mother declined multiple attempts at screening. Positive for GDM in previous pregnancy. Genetic Screening: Normal Maternal Ultrasounds/Referrals: Normal Fetal Ultrasounds or other Referrals:  None Maternal Substance Abuse:  No Significant Maternal Medications:  None Significant Maternal Lab Results: Group B Strep negative  Results for orders placed or performed during the hospital encounter of 03/27/19 (from the past 24 hour(s))  CBC   Collection Time: 03/27/19 11:55 AM  Result Value Ref Range   WBC 7.5 4.0 - 10.5 K/uL   RBC 4.55 3.87 - 5.11 MIL/uL   Hemoglobin 11.4 (L) 12.0 - 15.0 g/dL   HCT 35.4 (L) 36.0 - 46.0 %   MCV 77.8 (L) 80.0 - 100.0 fL   MCH 25.1 (L) 26.0 - 34.0 pg   MCHC 32.2 30.0 - 36.0 g/dL   RDW 15.9 (H) 11.5 - 15.5 %   Platelets 175 150 - 400 K/uL   nRBC 0.0 0.0 - 0.2 %  Type and screen [place]   Collection Time: 03/27/19 11:55 AM  Result Value Ref Range   ABO/RH(D) PENDING    Antibody Screen PENDING    Sample Expiration      03/30/2019,2359 Performed at [facility], [address] [place], [place] [address]     Patient Active Problem List   Diagnosis Date Noted  . Indication for care in labor or delivery 03/27/2019  . Noncompliance 03/14/2019  . History of gestational diabetes 10/06/2018  . Obesity in pregnancy 10/06/2018  . Supervision of high risk pregnancy, antepartum 09/08/2018  . Pseudotumor  cerebri     Assessment/Plan:  Ariana Bentley is a 28 y.o. G3P1011 at 59w2dhere for SOL, history of obesity and GDM in prior pregnancy.  #Labor: progressing without augmentation. Continue to manage expectantly. #Pain: IV pain medication for now.  Epidural upon request. #FWB: Category I #ID:  GBS neg #MOF: Breast #MOC: BTL following this hospitalization. #  Circ:  Outpatient circ #Hx of GDM: A1c 5.3 (8/21), BG 121 (8/22). Will obtain one random CBG. No further intervention if CBG is normal.    Matilde Haymaker, MD  03/27/2019, 12:44 PM  I was present for the exam and agree with above.  Tamala Julian, [place], [place] 03/27/2019 2:10 PM

## 2019-03-28 ENCOUNTER — Encounter (HOSPITAL_COMMUNITY): Payer: Self-pay | Admitting: Emergency Medicine

## 2019-03-28 ENCOUNTER — Encounter: Payer: Medicaid Other | Admitting: Family Medicine

## 2019-03-28 ENCOUNTER — Inpatient Hospital Stay (HOSPITAL_COMMUNITY): Payer: Medicaid Other

## 2019-03-28 LAB — RPR: RPR Ser Ql: NONREACTIVE

## 2019-03-28 MED ORDER — DIBUCAINE (PERIANAL) 1 % EX OINT: 1.0000 "application " | TOPICAL_OINTMENT | CUTANEOUS | Status: DC | PRN

## 2019-03-28 MED ORDER — WITCH HAZEL-GLYCERIN EX PADS: 1.0000 "application " | MEDICATED_PAD | CUTANEOUS | Status: DC | PRN

## 2019-03-28 MED ORDER — ACETAMINOPHEN 325 MG PO TABS
650.0000 mg | ORAL_TABLET | Freq: Four times a day (QID) | ORAL | Status: DC | PRN
  Administered 2019-03-28 – 2019-03-29 (×4): 650 mg via ORAL
  Filled 2019-03-28 (×4): qty 2

## 2019-03-28 MED ORDER — IBUPROFEN 600 MG PO TABS
600.0000 mg | ORAL_TABLET | Freq: Four times a day (QID) | ORAL | Status: DC | PRN
  Administered 2019-03-28 – 2019-03-29 (×7): 600 mg via ORAL
  Filled 2019-03-28 (×7): qty 1

## 2019-03-28 MED ORDER — BENZOCAINE-MENTHOL 20-0.5 % EX AERO: 1.0000 | INHALATION_SPRAY | CUTANEOUS | Status: DC | PRN

## 2019-03-28 MED ORDER — TETANUS-DIPHTH-ACELL PERTUSSIS 5-2.5-18.5 LF-MCG/0.5 IM SUSP: 0.5000 mL | Freq: Once | INTRAMUSCULAR | Status: DC

## 2019-03-28 MED ORDER — ONDANSETRON HCL 4 MG PO TABS: 4.0000 mg | ORAL_TABLET | ORAL | Status: DC | PRN

## 2019-03-28 MED ORDER — SENNOSIDES-DOCUSATE SODIUM 8.6-50 MG PO TABS
2.0000 | ORAL_TABLET | ORAL | Status: DC
  Administered 2019-03-29: 2 via ORAL
  Filled 2019-03-28: qty 2

## 2019-03-28 MED ORDER — MEASLES, MUMPS & RUBELLA VAC IJ SOLR: 0.5000 mL | Freq: Once | INTRAMUSCULAR | Status: DC

## 2019-03-28 MED ORDER — ONDANSETRON HCL 4 MG/2ML IJ SOLN: 4.0000 mg | INTRAMUSCULAR | Status: DC | PRN

## 2019-03-28 MED ORDER — DICLOFENAC SODIUM 1 % TD GEL
2.0000 g | Freq: Four times a day (QID) | TRANSDERMAL | Status: DC
  Administered 2019-03-28 – 2019-03-29 (×2): 2 g via TOPICAL
  Filled 2019-03-28: qty 100

## 2019-03-28 MED ORDER — DIPHENHYDRAMINE HCL 25 MG PO CAPS: 25.0000 mg | ORAL_CAPSULE | Freq: Four times a day (QID) | ORAL | Status: DC | PRN

## 2019-03-28 MED ORDER — PRENATAL MULTIVITAMIN CH
1.0000 | ORAL_TABLET | Freq: Every day | ORAL | Status: DC
  Administered 2019-03-28 – 2019-03-29 (×2): 1 via ORAL
  Filled 2019-03-28 (×2): qty 1

## 2019-03-28 MED ORDER — SIMETHICONE 80 MG PO CHEW: 80.0000 mg | CHEWABLE_TABLET | ORAL | Status: DC | PRN

## 2019-03-28 MED ORDER — COCONUT OIL OIL: 1.0000 "application " | TOPICAL_OIL | Status: DC | PRN

## 2019-03-28 NOTE — Progress Notes (Signed)
Upon moving patient from bed to wheelchair with assistance, patient had an assisted fall on her knee, and assisted back up into wheelchair. Pt had no complaints of pain or injury. Transferred to mother baby unit.

## 2019-03-28 NOTE — Progress Notes (Signed)
S) Pt seen at bedside today.  She reports that she continues to experience muscular, achy pain in her right gluteus following a fall last night.  From her description, she was transitioning from her bed to a wheelchair with the help of her midwife. She had her arms around the neck of the midwife and attempted to transition to the wheelchair but was unsuccessful and both women fell to the ground landing heavily on her right gluteus.  She now notices the pain most when trying to move her right leg or lean over in bed.  She has received tylenol and motrin without significant pain relief.  She is wondering if imaging would be helpful to ensure that she doesn't have any fractures.  O) BP 113/62 (BP Location: Left Arm)   Pulse 72   Temp 97.8 F (36.6 C) (Oral)   Resp 18   Ht 5\' 7"  (1.702 m)   Wt 124.7 kg   LMP 07/01/2018   SpO2 99%   Breastfeeding Unknown   BMI 43.07 kg/m   General: Resting comfortably in bed without pain unless she moves her lower extremities.  Able to walk around the room with a slightly wide gait shuffle, unassisted.  Resp: breathing comfortably in room air. MSK: negative leg raise bilaterally, severe dull pain noted over right gluteus with passive movement of right and left leg.  Mild tenderness to palpation over the right lower quadrant of the right gluteus.  No pain with palpation of greater trochanter, right SI joint, lumbar spine or paraspinal region. No apparent deformity of the right leg.  A/P)  Most likely musculoskeletal pain.  Low suspicion for fracture.  Pain on exam does seem out of proportion for bruising.  Will obtain right hip and pelvis xray to ensure no fracture.   -f/u hip x-ray -tylenol -ibuprofen -heating pads -Voltaren gel

## 2019-03-28 NOTE — Progress Notes (Signed)
Upon receiving patient onto unit, this RN was instructing patient to not get up without staff assistance due to epidural and risk for falls, patient then informs this RN that "I probably shouldn't tell you this, but I fell in labor and delivery". This RN called Jeannette Corpus RN who cared for Ms. Densmore prior to arrival to postpartum unit and clarified the incident. This RN was informed that "one knee touched the floor" and was asked what to do by Labor and delivery RN. This RN encouraged Emilee RN to report to her charge nurse to follow protocol for falls and get further instruction on what to do. Patient appears stable upon arrival to unit, VS WNL and assessment WNL. Will continue to monitor.

## 2019-03-28 NOTE — Progress Notes (Signed)
Mom is able to ambulate independently. But do to hip pain Rn encourage mom to call when she going to bathroom.

## 2019-03-28 NOTE — Anesthesia Postprocedure Evaluation (Signed)
Anesthesia Post Note  Patient: Ariana Bentley  Procedure(s) Performed: AN AD Daniel     Patient location during evaluation: Mother Baby Anesthesia Type: Epidural Level of consciousness: awake and alert Pain management: pain level controlled Vital Signs Assessment: post-procedure vital signs reviewed and stable Respiratory status: spontaneous breathing, nonlabored ventilation and respiratory function stable Cardiovascular status: stable Postop Assessment: no headache, no backache and epidural receding Anesthetic complications: no    Last Vitals:  Vitals:   03/28/19 0230 03/28/19 0630  BP: 122/62 114/64  Pulse: 84 89  Resp: 18 18  Temp: 36.9 C 37 C  SpO2: 98% 98%    Last Pain:  Vitals:   03/28/19 0700  TempSrc:   PainSc: Asleep   Pain Goal:                   Drucie Opitz

## 2019-03-28 NOTE — Progress Notes (Signed)
CSW acknowledges consult for THC and anxiety/depression. CSW went to speak with MOB at bedside however, MOB requested that CSW return as she was "just falling off to sleep". CSW advised MOB that CSW would return after lunch. MOB understanding.      Amel Gianino S. Iram Astorino, MSW, LCSW [facility] [phone]   

## 2019-03-28 NOTE — Lactation Note (Signed)
This note was copied from a baby's chart. Lactation Consultation Note  Patient Name: Ariana Bentley AYTKZ'S Date: 03/28/2019 Reason for consult: Term;1st time breastfeeding  P2 mother whose infant is now 42 hours old.  Mother did not breast feed her first child (now 27 years old).  Mother's feeding preference is breast/bottle.  Mother had recently finished formula feeding baby when I arrived.  She felt like she "did not have anything" for baby and she did not want "to deprive him" of food.  Provided a lot of basic breast feeding education including discussion about colostrum, milk coming to volume, hand expression, feeding cues, latch assistance and STS.  Taught hand expression and mother was unable to obtain any colostrum drops at this time.  Colostrum container provided and milk storage times reviewed.  Finger feeding demonstrated.  Spoke with mother about the size of baby's tummy and that he does not require a large volume of supplementation at this time.  Suggested she call her RN/LC when she observes feeding cues and we would be happy to help baby latch.  Mother agreeable.  She really desires to have the DEBP set up because she wants to get the milk "flowing" and she does not see it "flowing" yet.  Continued to educate her as I answered many questions.  Reminded her that breast feeding is a learning process for mother and baby and that it takes practice and patience to feel real successful.  I wanted to make her feel comfortable and reassure her that we will be here for assistance.  Asked her to always put baby to the breast prior to any supplementation.  During our discussion mother received her food tray and wants to eat prior to pumping.  I asked her to call me when she is finished and I will set up the DEBP for her.  Mom made aware of O/P services, breastfeeding support groups, community resources, and our phone # for post-discharge questions. Mother has a [facility] appointment over the phone and will  obtain her DEBP after discharge.  Her sister is her support person and she is with her today.     Maternal Data Formula Feeding for Exclusion: Yes Reason for exclusion: Mother's choice to formula and breast feed on admission Has patient been taught Hand Expression?: Yes Does the patient have breastfeeding experience prior to this delivery?: No  Feeding Feeding Type: Formula Nipple Type: Slow - flow  LATCH Score                   Interventions    Lactation Tools Discussed/Used WIC Program: Yes Pump Review: Setup, frequency, and cleaning;Milk Storage Initiated by:: Surafel Hilleary Date initiated:: 03/28/19   Consult Status Consult Status: Follow-up Date: 03/29/19 Follow-up type: In-patient    Little Ishikawa 03/28/2019, 12:29 PM

## 2019-03-28 NOTE — Progress Notes (Addendum)
Pt c/o right hip pain. Pt states "it may be due to when I fell in L& D." Darrol Poke CNM  notified of pt complain of right hip pain and fall after delivery. Pt has been able to ambulate to bathroom with stand by assist only. No redness noted at area. MD to evaluate pt at bedside.

## 2019-03-28 NOTE — Progress Notes (Signed)
Dr Pilar Plate at Bedside at 1720 to exam pt. New orders received at this time.

## 2019-03-28 NOTE — Lactation Note (Signed)
This note was copied from a baby's chart. Lactation Consultation Note  Patient Name: Ariana Bentley MVHQI'O Date: 03/28/2019 Reason for consult: Term;1st time breastfeeding  P2 mother whose infant is now 89 hours old.  Mother called for follow up to begin the DEBP.  Mother needed further education when I returned to set up the DEBP.  She is very anxious about "not having enough" for baby.  Pump parts, assembly, disassembly and cleaning reviewed.  #24 flange size is appropriate at this time.  However, I did inform mother that she may need to increase to a #27 as her milk comes to volume.  Mother will call for latch assistance as needed and desires to post pump for 15 minutes after feedings.  Encouraged to keep a positive attitude about breast feeding/pumping and to relax.  Again, reminded her to be patient.  Mother verbalized understanding.  RN updated.   Maternal Data Formula Feeding for Exclusion: Yes Reason for exclusion: Mother's choice to formula and breast feed on admission Has patient been taught Hand Expression?: Yes Does the patient have breastfeeding experience prior to this delivery?: No  Feeding    LATCH Score                   Interventions    Lactation Tools Discussed/Used WIC Program: Yes Pump Review: Setup, frequency, and cleaning;Milk Storage Initiated by:: Dontrez Pettis Date initiated:: 03/28/19   Consult Status Consult Status: Follow-up Date: 03/29/19 Follow-up type: In-patient    Keeleigh Terris R Teja Judice 03/28/2019, 1:08 PM
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
## 2019-03-28 NOTE — Discharge Summary (Signed)
Postpartum Discharge Summary    Patient Name: Ariana Bentley DOB: 1990-11-12 MRN: [id number]  Date of admission: 03/27/2019 Delivering Provider: [name]   Date of discharge: 03/29/2019  Admitting diagnosis: CTX 2mns Intrauterine pregnancy: 336w3d   Secondary diagnosis:  Active Problems:   Pseudotumor cerebri   History of gestational diabetes   Obesity in pregnancy   Indication for care in labor or delivery   SVD (spontaneous vaginal delivery)  Additional problems: None     Discharge diagnosis: Term Pregnancy Delivered                                                                                                Post partum procedures:None  Augmentation: AROM  Complications: None  Hospital course:  Onset of Labor With Vaginal Delivery     [age].o. yo G3P1011 at 3980w3ds admitted in Latent Labor on 03/27/2019. Patient had an uncomplicated labor course as follows. Patient presented to L&D for SOL. Initial SVE: 4/50/-3. Patient received Epidural and AROM performed. She then progressed to complete.   Membrane Rupture Time/Date: 8:40 PM ,03/27/2019   Intrapartum Procedures: Episiotomy: None [1]                                         Lacerations:  1st degree [2];Perineal [11]  Patient had a delivery of a Viable infant. 03/27/2019  Information for the patient's newborn:  McKElany, Felix3[[id number]]    Pateint had an uncomplicated postpartum course.  Patient fell shortly after delivery. Had a negative right hip and pelvis x-ray. She is ambulating, tolerating a regular diet, passing flatus, and urinating well. Patient is discharged home in stable condition on 03/29/19.  Delivery time: 10:38 PM    Magnesium Sulfate received: No BMZ received: No Rhophylac:No MMR:No Transfusion:No  Physical exam  Vitals:   03/28/19 1000 03/28/19 1410 03/28/19 2134 03/29/19 0430  BP: 110/72 113/62 (!) 104/51 (!) 102/53  Pulse: 62 72 93 75  Resp: _0 Temp: 98.2 F (36.8 C)  97.8 F (36.6 C) 98.1 F (36.7 C) 98.4 F (36.9 C)  TempSrc: Oral Oral Oral Oral  SpO2: 99% 99%    Weight:      Height:       General: alert and cooperative Lochia: appropriate Uterine Fundus: firm Incision: N/A DVT Evaluation: No evidence of DVT seen on physical exam. Labs: Lab Results  Component Value Date   WBC 7.5 03/27/2019   HGB 11.4 (L) 03/27/2019   HCT 35.4 (L) 03/27/2019   MCV 77.8 (L) 03/27/2019   PLT 175 03/27/2019   CMP Latest Ref Rng & Units 03/18/2019  Glucose 70 - 99 mg/dL 121(H)  BUN 6 - 20 mg/dL 5(L)  Creatinine 0.44 - 1.00 mg/dL 0.73  Sodium 135 - 145 mmol/L 133(L)  Potassium 3.5 - 5.1 mmol/L 3.7  Chloride 98 - 111 mmol/L 105  CO2 22 - 32 mmol/L 16(L)  Calcium 8.9 - 10.3 mg/dL 9.2  Total Protein 6.5 - 8.1 g/dL 6.8  Total Bilirubin 0.3 - 1.2 mg/dL 0.5  Alkaline Phos 38 - 126 U/L 108  AST 15 - 41 U/L 21  ALT 0 - 44 U/L 16    Discharge instruction: per After Visit Summary and "Baby and Me Booklet".  After visit meds:  Allergies as of 03/29/2019      Reactions   Shellfish Allergy Anaphylaxis   Doxycycline Other (See Comments)   Causes swelling behind the eyes; aggravates PTC condition      Medication List    TAKE these medications   acetaminophen 500 MG tablet Commonly known as: TYLENOL Take 2 tablets (1,000 mg total) by mouth every 6 (six) hours as needed.   Comfort Fit Maternity Supp Lg Misc 1 application by Does not apply route daily as needed.   ibuprofen 600 MG tablet Commonly known as: ADVIL Take 1 tablet (600 mg total) by mouth every 6 (six) hours as needed for mild pain.   OMEGA DHA PO Take 1 tablet by mouth daily.   PRENATAL GUMMIES/DHA & FA PO Take by mouth.   senna-docusate 8.6-50 MG tablet Commonly known as: Senokot-S Take 2 tablets by mouth daily. Start taking on: March 30, 2019       Diet: routine diet  Activity: Advance as tolerated. Pelvic rest for 6 weeks.   Outpatient follow up:4 weeks Follow up  Appt: Future Appointments  Date Time Provider [place]  04/26/2019  9:15 AM [place], [place]  05/10/2019  8:20 AM [facility]   Follow up Visit: [place] for [facility]. Schedule an appointment as soon as possible for a visit in 4 week(s).   Specialty: Obstetrics and Gynecology Why: For postpartum follow up  Contact information: [address] [place], [place] [id number] [place] [id number] [phone]           Please schedule this patient for Postpartum visit in: 4 weeks with the following provider: Any provider Low risk pregnancy complicated by: Patient did not have GTT done Delivery mode:  SVD Anticipated Birth Control:  Plans Interval BTL PP Procedures needed: 2 hour GTT  Schedule Integrated [place] visit: no      Newborn Data: Live born female  Birth Weight: 7 lb 0.2 oz (3181 g) APGAR: 9, 9  Newborn Delivery   Time head delivered: 03/27/2019 22:38:01 Birth date/time: 03/27/2019 22:38:00 Delivery type: Vaginal, Spontaneous      Baby Feeding: Breast Disposition:home with mother   03/29/2019 Melina Schools, DO

## 2019-03-28 NOTE — Progress Notes (Signed)
Post Partum Day 1 Subjective: Patient is ambulating and tolerating PO. Minimal bleeding and pain well-controlled. Attempting to breast feed. She would like an interim BTL.   Objective: Blood pressure 122/62, pulse 84, temperature 98.5 F (36.9 C), temperature source Oral, resp. rate 18, height 5\' 7"  (1.702 m), weight 124.7 kg, last menstrual period 07/01/2018, SpO2 98 %, unknown if currently breastfeeding.  Physical Exam:  General: alert, cooperative, appears stated age and moderately obese Lochia: appropriate Uterine Fundus: firm DVT Evaluation: No evidence of DVT seen on physical exam.  Recent Labs    03/27/19 1155  HGB 11.4*  HCT 35.4*    Assessment/Plan: Plan for discharge tomorrow  Breastfeeding; lactation assistance appreciated Interim BTL to be scheduled outpatient  [facility] outpatient Circ   LOS: 1 day   Lucian Baswell N Shavana Calder 03/28/2019, 5:27 AM

## 2019-03-29 MED ORDER — IBUPROFEN 600 MG PO TABS: 600.0000 mg | ORAL_TABLET | Freq: Four times a day (QID) | ORAL | 1 refills | Status: AC | PRN

## 2019-03-29 MED ORDER — SENNOSIDES-DOCUSATE SODIUM 8.6-50 MG PO TABS: 2.0000 | ORAL_TABLET | ORAL | 0 refills | Status: AC

## 2019-03-29 NOTE — Lactation Note (Signed)
This note was copied from a baby's chart. Lactation Consultation Note  Patient Name: Ariana Bentley [id number] Date: 03/29/2019 Reason for consult: Follow-up assessment  P2 mother whose infant is now 8 hours old.  Mother did not breast feed her first child (now 27 years old).  Mother's feeding preference is breast/bottle.  Mother had no questions/concerns related to breast feeding.  She stated, "I'm gonna keep on working on it."  Provided comprehensive education yesterday on breast feeding basics.  Mother has been able to express colostrum drops and feed these drops back to baby.  Reminded her to always latch prior to any supplementation.  Mother plans to pump and store milk.  I referred her to the milk storage times in her Mother/Baby booklet.  Engorgement prevention/treatment reviewed.  Mother has a manual breast pump for home use.  She has a [facility] appointment tomorrow and is already planning on obtaining a DEBP from [facility].  She has our OP phone number for questions/concerns after discharge.   Maternal Data    Feeding    LATCH Score                   Interventions    Lactation Tools Discussed/Used     Consult Status Consult Status: Complete Date: 03/29/19 Follow-up type: Call as needed    Ariana Bentley 03/29/2019, 11:49 AM

## 2019-03-29 NOTE — Discharge Instructions (Signed)

## 2019-03-29 NOTE — Progress Notes (Signed)
Per shift report by Lanny Cramp Rn, mom had fallen in labor and delivery on her hip. Mom had told Vinson Moselle Rn , that her hip was hurting. Wilson RN stated she called Doctor who ordered an x Ray of hip. Night shift RN discussed with mom that she needed to call Rn for assistance to bathroom. Rn used a wheelchair to take mom to bathroom. Per Lanny Cramp Rn , mom had been walking in the room.

## 2019-03-29 NOTE — Progress Notes (Signed)
POSTPARTUM PROGRESS NOTE  Post Partum Day 2  Subjective:  Ariana Bentley is a 28 y.o. H6D1497 s/p SVD at [redacted]w[redacted]d.  She reports she is doing well. No acute events overnight. She denies any problems with ambulating, voiding or po intake. Denies nausea or vomiting.  Pain is well controlled.  Lochia is light. Patient fell on hip yesterday and received an X-ray that revealed no fractures or dislocations. Mild pain still in hip.  Objective: Blood pressure (!) 102/53, pulse 75, temperature 98.4 F (36.9 C), temperature source Oral, resp. rate 18, height 5\' 7"  (1.702 m), weight 124.7 kg, last menstrual period 07/01/2018, SpO2 99 %, unknown if currently breastfeeding.  Physical Exam:  General: alert, cooperative and no distress Chest: no respiratory distress Heart:regular rate, distal pulses intact Abdomen: soft, nontender,  Uterine Fundus: firm, appropriately tender DVT Evaluation: No calf swelling or tenderness Extremities: no edema Skin: warm, dry  Recent Labs    03/27/19 1155  HGB 11.4*  HCT 35.4*    Assessment/Plan: Ariana Bentley is a 28 y.o. W2O3785 s/p SVD at [redacted]w[redacted]d   PPD#2 - Doing well  Routine postpartum care Contraception: BTL in future Feeding: Breast and bottle Dispo: Plan for discharge 03/29/2019.   LOS: 2 days   Belenda Cruise, Medical Student 03/29/2019, 7:57 AM

## 2019-03-29 NOTE — Plan of Care (Signed)
  Problem: Education: Goal: Knowledge of General Education information will improve Description: Including pain rating scale, medication(s)/side effects and non-pharmacologic comfort measures Outcome: Adequate for Discharge   Problem: Pain Managment: Goal: General experience of comfort will improve Outcome: Adequate for Discharge   Problem: Clinical Measurements: Goal: Ability to maintain clinical measurements within normal limits will improve Outcome: Completed/Met Goal: Will remain free from infection Outcome: Completed/Met Goal: Diagnostic test results will improve Outcome: Completed/Met Goal: Respiratory complications will improve Outcome: Completed/Met Goal: Cardiovascular complication will be avoided Outcome: Completed/Met   Problem: Nutrition: Goal: Adequate nutrition will be maintained Outcome: Completed/Met   Problem: Coping: Goal: Level of anxiety will decrease Outcome: Completed/Met   Problem: Elimination: Goal: Will not experience complications related to bowel motility Outcome: Completed/Met Goal: Will not experience complications related to urinary retention Outcome: Completed/Met   Problem: Safety: Goal: Ability to remain free from injury will improve Outcome: Completed/Met   Problem: Skin Integrity: Goal: Risk for impaired skin integrity will decrease Outcome: Completed/Met   Problem: Education: Goal: Knowledge of condition will improve Outcome: Completed/Met   Problem: Activity: Goal: Will verbalize the importance of balancing activity with adequate rest periods Outcome: Completed/Met Goal: Ability to tolerate increased activity will improve Outcome: Completed/Met   Problem: Coping: Goal: Ability to identify and utilize available resources and services will improve Outcome: Completed/Met   Problem: Life Cycle: Goal: Chance of risk for complications during the postpartum period will decrease Outcome: Completed/Met   Problem: Role  Relationship: Goal: Ability to demonstrate positive interaction with newborn will improve Outcome: Completed/Met   Problem: Skin Integrity: Goal: Demonstration of wound healing without infection will improve Outcome: Completed/Met

## 2019-03-29 NOTE — Plan of Care (Signed)
  Problem: Education: Goal: Knowledge of General Education information will improve Description: Including pain rating scale, medication(s)/side effects and non-pharmacologic comfort measures Outcome: Adequate for Discharge   Problem: Pain Managment: Goal: General experience of comfort will improve 03/29/2019 0348 by Yancey Flemings, RN Outcome: Adequate for Discharge 03/29/2019 0343 by Yancey Flemings, RN Outcome: Adequate for Discharge   Problem: Clinical Measurements: Goal: Ability to maintain clinical measurements within normal limits will improve Outcome: Completed/Met Goal: Will remain free from infection Outcome: Completed/Met Goal: Diagnostic test results will improve Outcome: Completed/Met Goal: Respiratory complications will improve Outcome: Completed/Met Goal: Cardiovascular complication will be avoided Outcome: Completed/Met   Problem: Nutrition: Goal: Adequate nutrition will be maintained Outcome: Completed/Met   Problem: Coping: Goal: Level of anxiety will decrease Outcome: Completed/Met   Problem: Elimination: Goal: Will not experience complications related to bowel motility Outcome: Completed/Met Goal: Will not experience complications related to urinary retention Outcome: Completed/Met   Problem: Safety: Goal: Ability to remain free from injury will improve Outcome: Completed/Met   Problem: Skin Integrity: Goal: Risk for impaired skin integrity will decrease Outcome: Completed/Met   Problem: Education: Goal: Knowledge of condition will improve Outcome: Completed/Met   Problem: Activity: Goal: Will verbalize the importance of balancing activity with adequate rest periods Outcome: Completed/Met Goal: Ability to tolerate increased activity will improve Outcome: Completed/Met   Problem: Coping: Goal: Ability to identify and utilize available resources and services will improve Outcome: Completed/Met   Problem: Life Cycle: Goal: Chance of risk  for complications during the postpartum period will decrease Outcome: Completed/Met   Problem: Role Relationship: Goal: Ability to demonstrate positive interaction with newborn will improve Outcome: Completed/Met   Problem: Skin Integrity: Goal: Demonstration of wound healing without infection will improve Outcome: Completed/Met

## 2019-04-06 ENCOUNTER — Encounter: Payer: Medicaid Other | Admitting: Obstetrics & Gynecology

## 2019-04-25 ENCOUNTER — Telehealth: Payer: Self-pay | Admitting: Student

## 2019-04-25 NOTE — Telephone Encounter (Signed)
Attempted to contact patient about her appointment on 9/29 @ 9:15. Patient instructed that the appointment is a mychart visit. Patient instructed to download the mychart app if not already done so. Patient instructed a nurse will give her a call around her appointment time. Patient instructed to give the office a call with any concerns.

## 2019-04-26 ENCOUNTER — Other Ambulatory Visit: Payer: Self-pay

## 2019-04-26 ENCOUNTER — Telehealth: Payer: Medicaid Other | Admitting: Student

## 2019-04-26 DIAGNOSIS — Z91199 Patient's noncompliance with other medical treatment and regimen due to unspecified reason: Secondary | ICD-10-CM

## 2019-04-26 DIAGNOSIS — Z5329 Procedure and treatment not carried out because of patient's decision for other reasons: Secondary | ICD-10-CM

## 2019-04-26 NOTE — Progress Notes (Signed)
@  907am no answer lvm for appointment reminder advised will call back in a few minuets to get her checked in to please answer.  @920am  no answer lvm to call and reschedule as needed for PP Visit.

## 2019-05-05 ENCOUNTER — Telehealth: Payer: Medicaid Other | Admitting: Obstetrics and Gynecology

## 2019-05-05 ENCOUNTER — Other Ambulatory Visit: Payer: Self-pay

## 2019-05-05 NOTE — Progress Notes (Signed)
Called pt @ 1051 and LM that I am calling in regards to her appt scheduled with Anderson Malta.  I stated that I will call back in 15 min in which if I do not reach her I will have to request that she reschedules her appt.   Called pt @ 1111 and LM that this is my second attempt in trying to reach her for her appt.  If she could please call the office to reschedule her appt.

## 2019-05-10 ENCOUNTER — Other Ambulatory Visit: Payer: Medicaid Other

## 2019-05-12 ENCOUNTER — Other Ambulatory Visit: Payer: Self-pay | Admitting: Emergency Medicine

## 2019-05-12 DIAGNOSIS — Z8632 Personal history of gestational diabetes: Secondary | ICD-10-CM

## 2019-05-13 ENCOUNTER — Encounter: Payer: Self-pay | Admitting: Obstetrics & Gynecology

## 2019-05-13 ENCOUNTER — Other Ambulatory Visit: Payer: Medicaid Other

## 2020-03-10 IMAGING — US US OB TRANSVAGINAL
1 series · 15 of 28 positions shown · non-contrast
Comparison: 08/04/2018

CLINICAL DATA: Abdominal pain in 1st trimester pregnancy. Current
assigned gestational age of 7 weeks 6 days by prior ultrasound.

EXAM:
TRANSVAGINAL OB ULTRASOUND
TECHNIQUE: Transvaginal ultrasound was performed for complete evaluation of the
gestation as well as the maternal uterus, adnexal regions, and
pelvic cul-de-sac.

[Series 1: us ob transvaginal · 37 acquisitions, 15 frames shown]
[im 1/37]
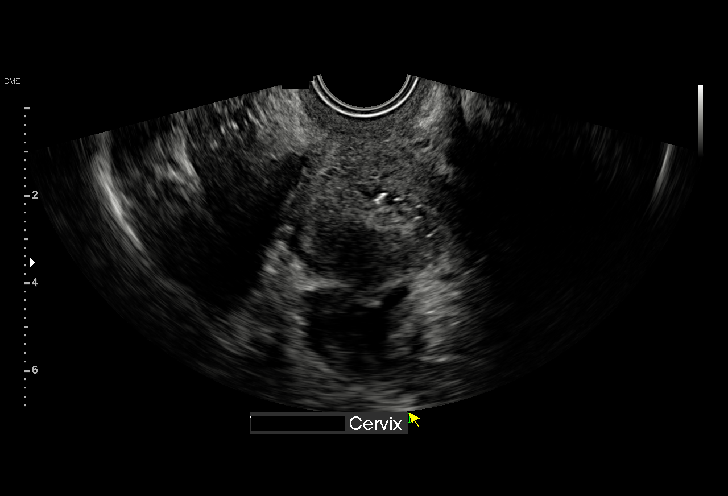
[im 3/37]
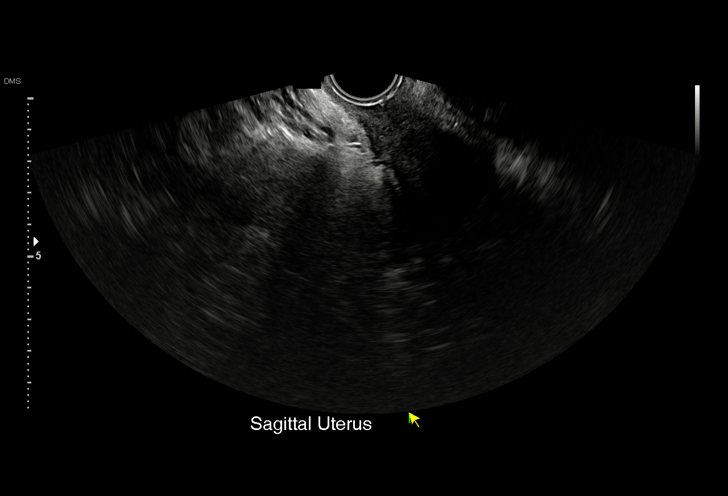
[im 6/37]
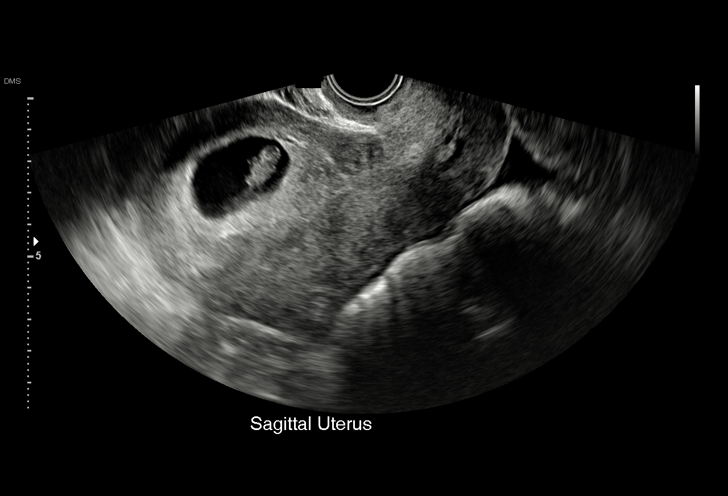
[im 9/37]
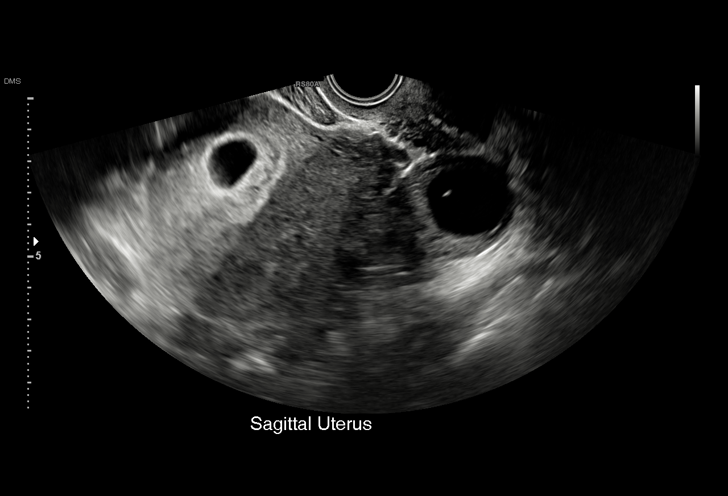
[im 11/37]
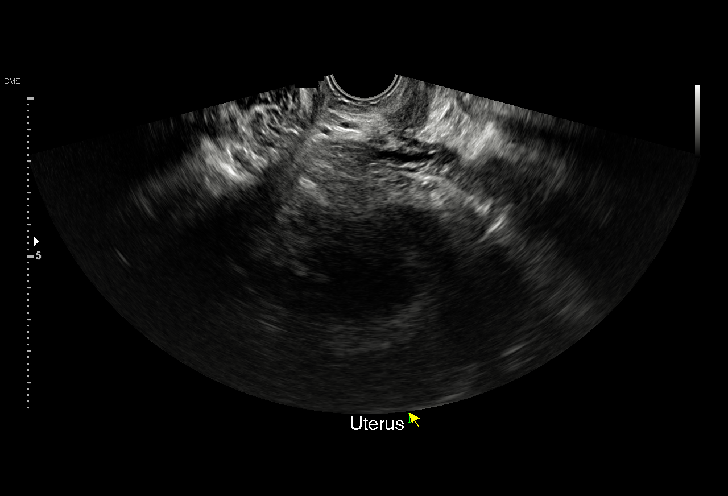
[im 14/37]
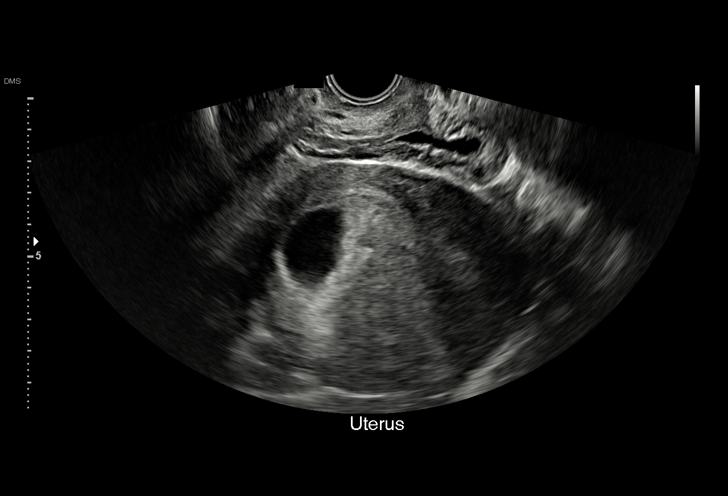
[im 17/37]
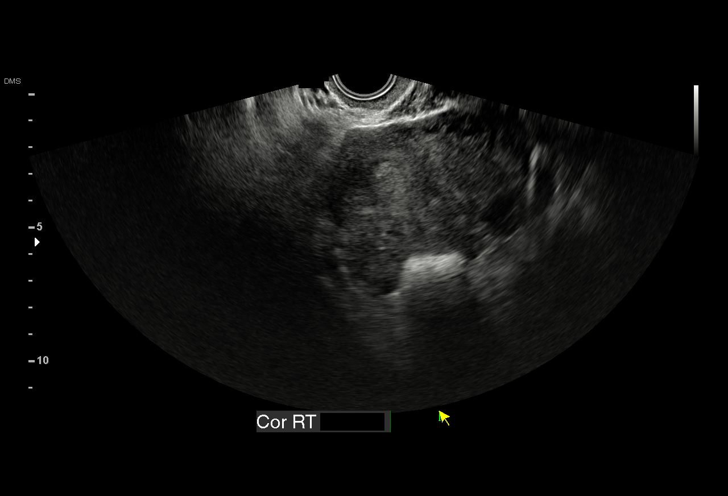
[im 19/37]
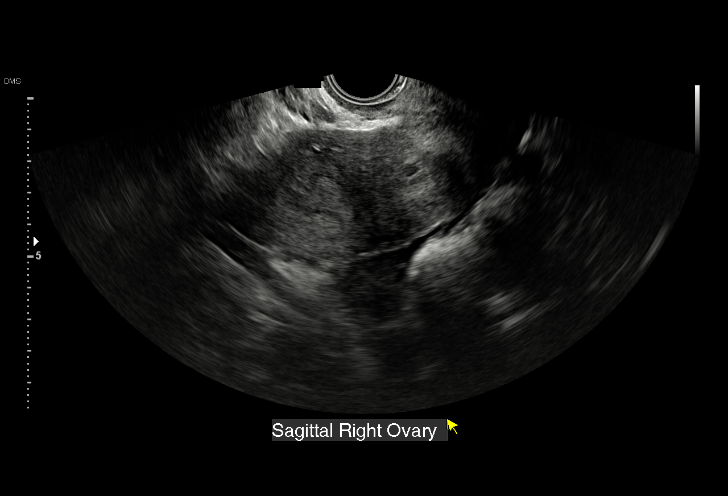
[im 21/37]
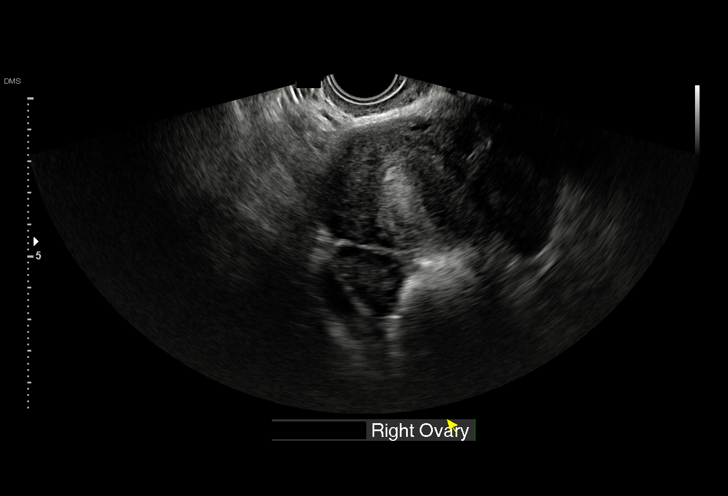
[im 23/37]
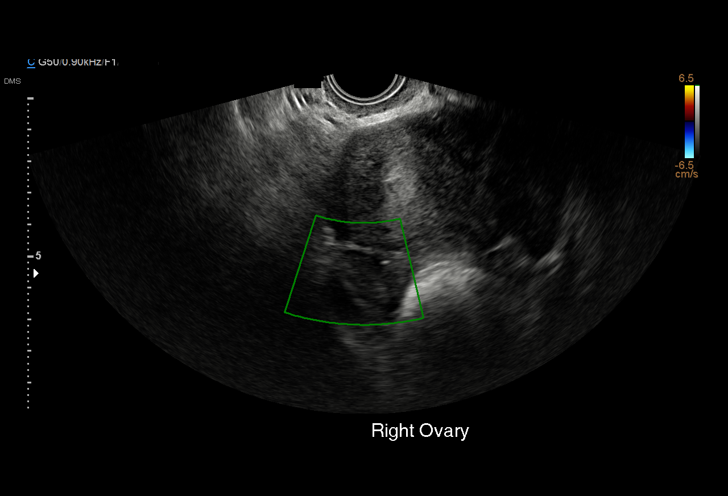
[im 26/37]
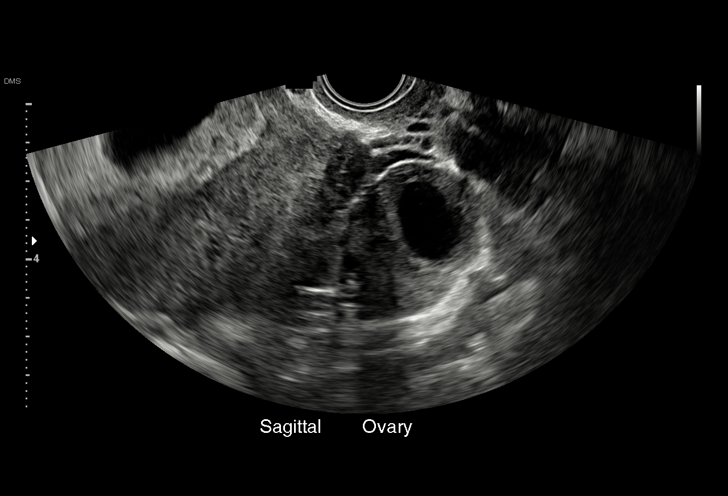
[im 29/37]
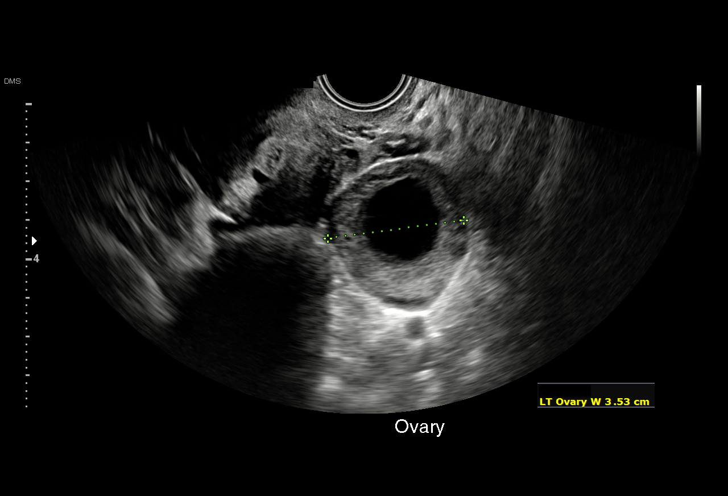
[im 31/37]
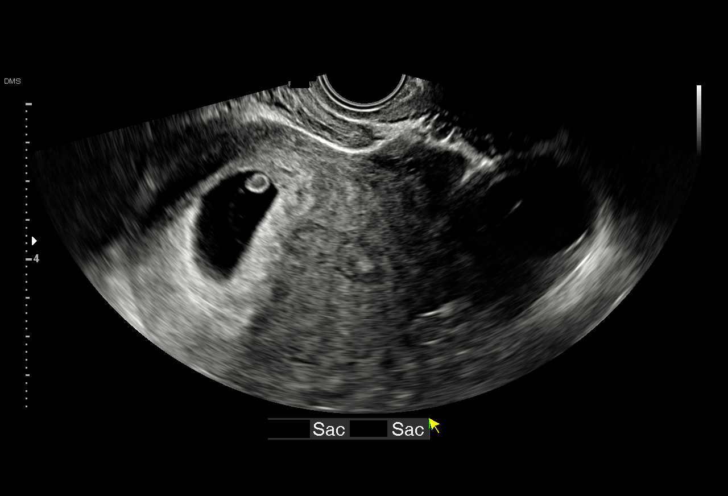
[im 34/37]
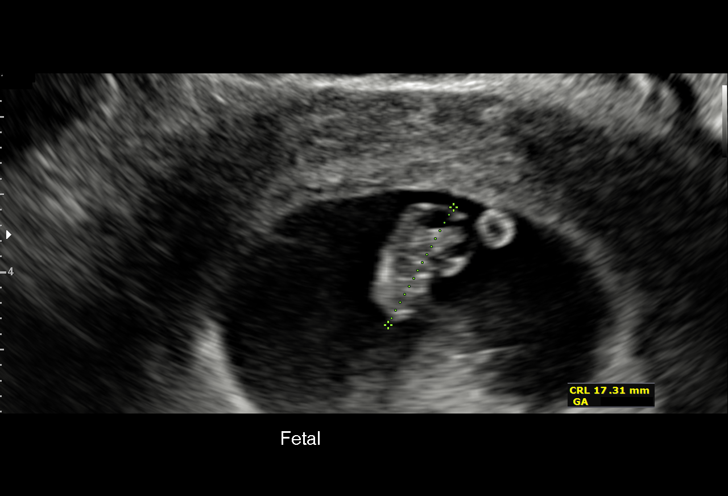
[im 37/37]
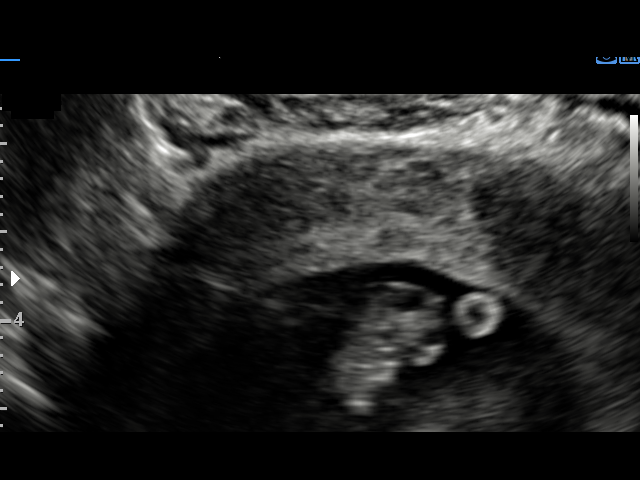

[15 of 28 positions shown; findings below may reference images not displayed]

FINDINGS: Intrauterine gestational sac: Single

Yolk sac:  Visualized.

Embryo:  Visualized.

Cardiac Activity: Visualized.

Heart Rate: 180 bpm

CRL:   17.5 mm   8 w 1 d                  US EDC: 03/30/2019

Subchorionic hemorrhage:  None visualized.

Maternal uterus/adnexae: Left ovarian corpus luteum noted. Normal
appearance of right ovary. No mass or abnormal free fluid
identified.
IMPRESSION: Single living IUP with assigned gestational age of 7 weeks 6 days by
prior ultrasound. Appropriate fetal growth.

No significant maternal uterine or adnexal abnormality identified.

## 2020-09-02 IMAGING — US US MFM OB FOLLOW UP
1 series · 14 of 28 positions shown · non-contrast
Comparison: none

[Series 1: us mfm ob follow up · 44 acquisitions, 14 frames shown]
[im 2/44]
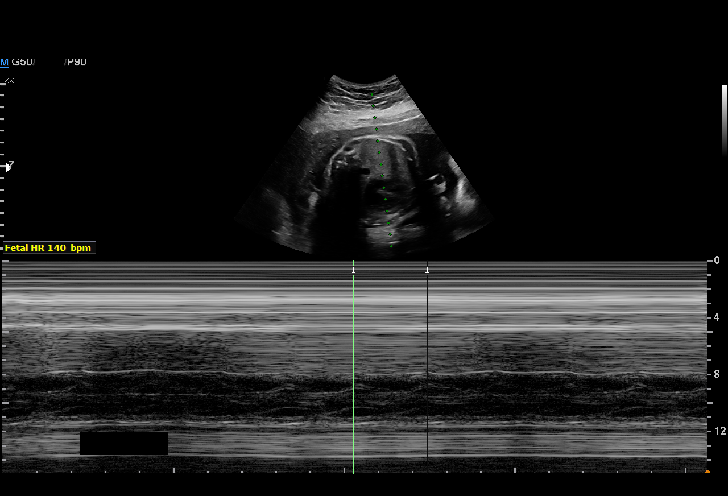
[im 5/44]
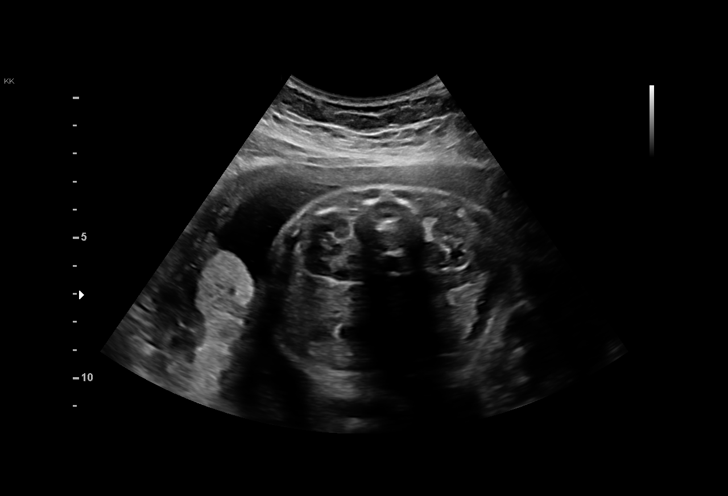
[im 8/44]
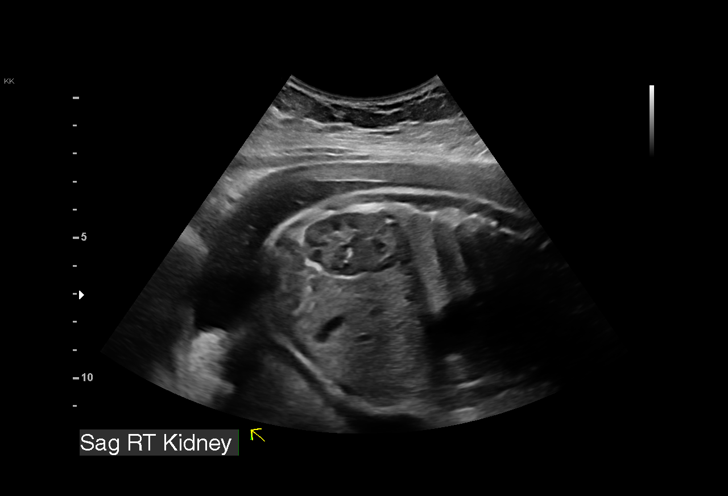
[im 12/44]
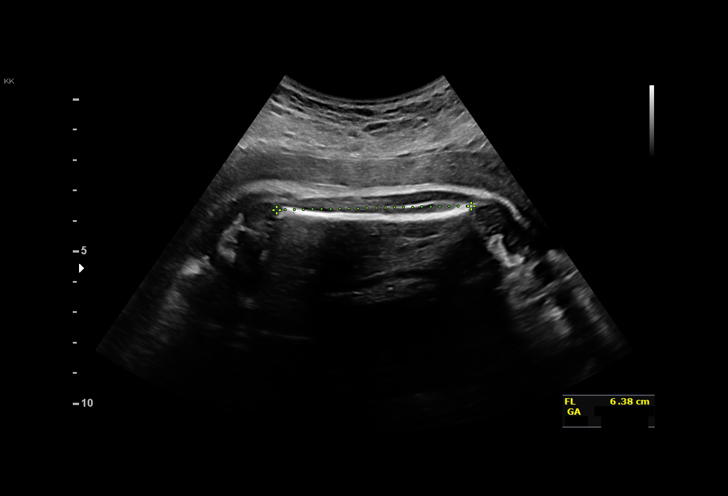
[im 15/44]
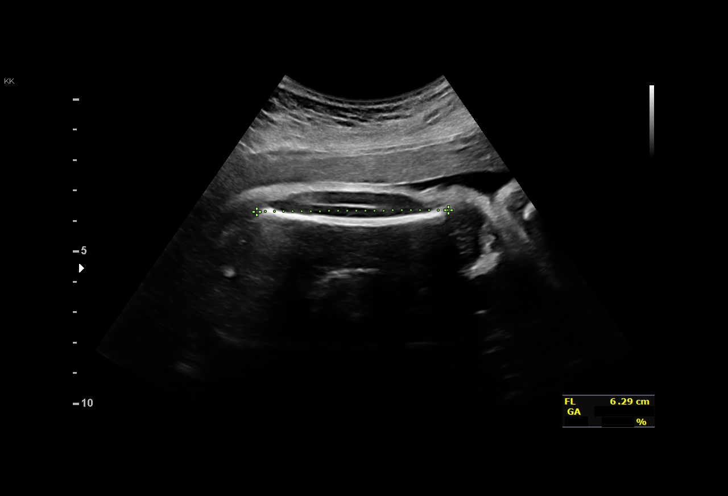
[im 18/44]
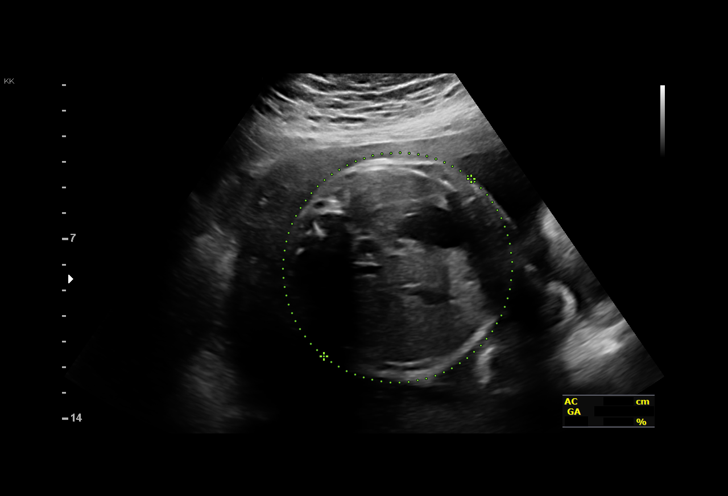
[im 21/44]
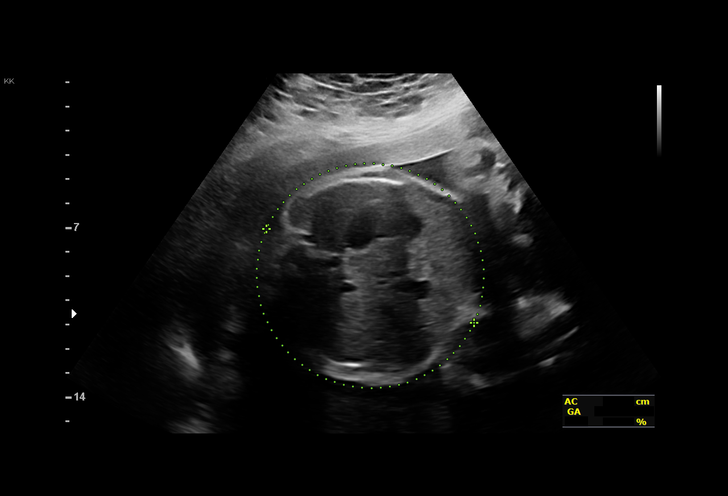
[im 24/44]
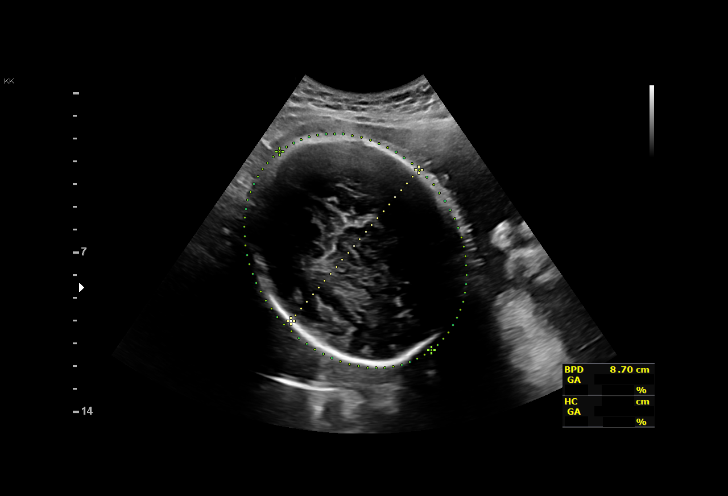
[im 28/44]
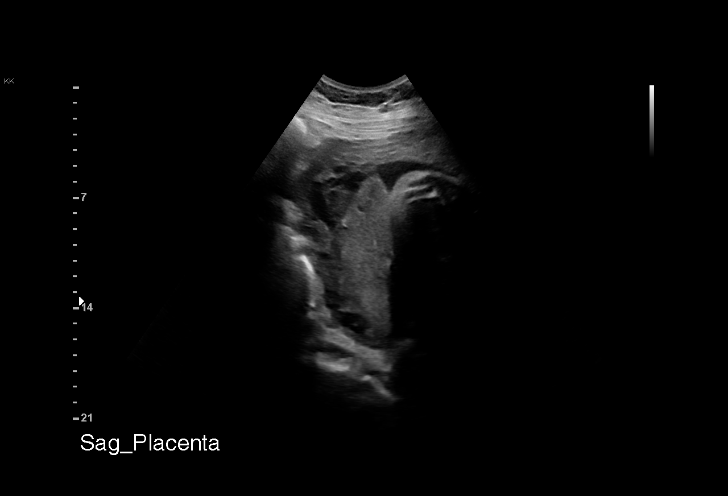
[im 31/44]
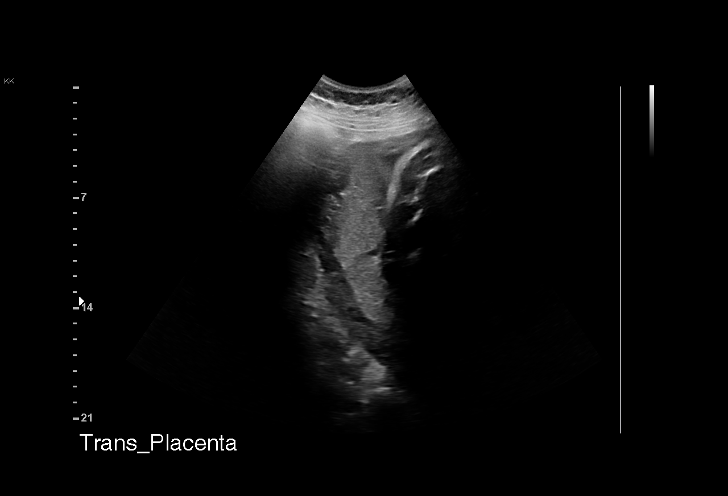
[im 34/44]
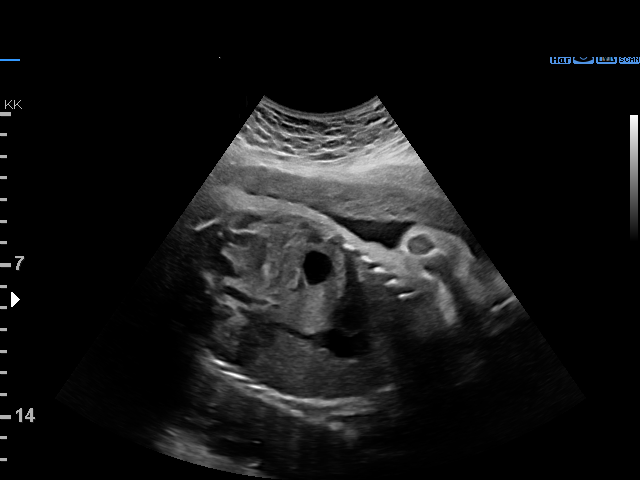
[im 37/44]
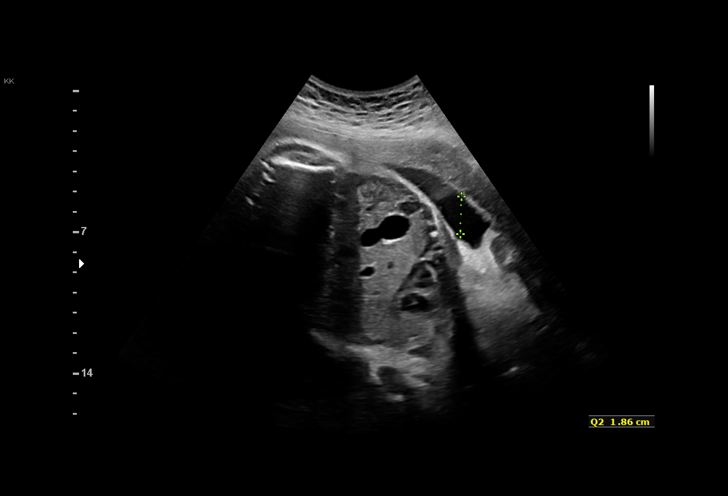
[im 40/44]
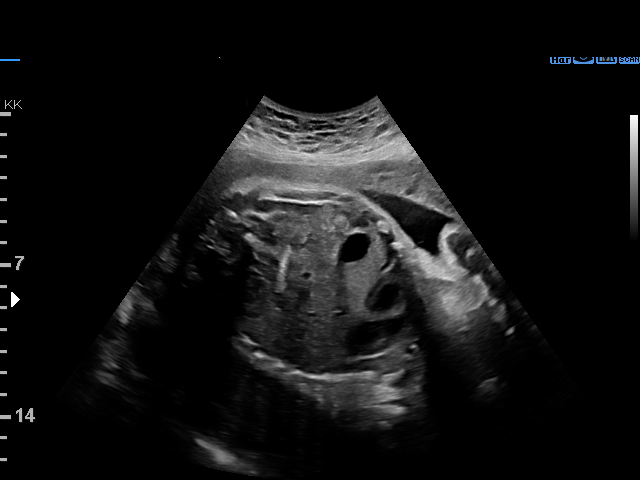
[im 44/44]
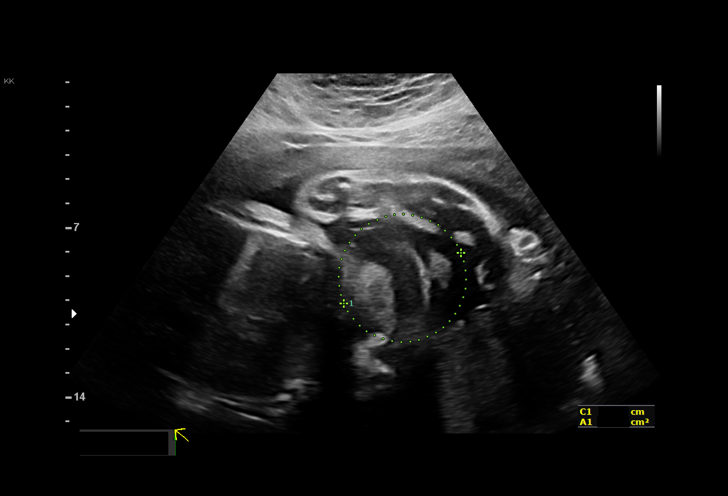

[14 of 28 positions shown; findings below may reference images not displayed]

[address]

                                                       [place]
 ----------------------------------------------------------------------

 ----------------------------------------------------------------------
Indications

  33 weeks gestation of pregnancy
  Medical complication of pregnancy
  (pseudotumor cerebri)
  Obesity complicating pregnancy, second
  trimester (pregravid BMI 42)
  Poor obstetric history: Previous gestational
  diabetes
  Uterine size-date discrepancy, second
  trimester
  Negative AFP
  Low Risk NIPS
  Encounter for other antenatal screening
  follow-up
  Poor obstetric history: Previous gestational
  diabetes
 ----------------------------------------------------------------------
Vital Signs

 BMI:
Fetal Evaluation

 Num Of Fetuses:         1
 Fetal Heart Rate(bpm):  140
 Cardiac Activity:       Observed
 Presentation:           Cephalic
 Placenta:               Posterior
 P. Cord Insertion:      Previously Visualized

 Amniotic Fluid
 AFI FV:      Within normal limits
 AFI Sum(cm)     %Tile       Largest Pocket(cm)
 16.13           58

 RUQ(cm)       RLQ(cm)       LUQ(cm)        LLQ(cm)

Biometry

 BPD:      86.6  mm     G. Age:  35w 0d         91  %    CI:        74.89   %    70 - 86
                                                         FL/HC:      20.1   %    19.9 -
 HC:      317.5  mm     G. Age:  35w 5d         82  %    HC/AC:      1.11        0.96 -
 AC:      286.9  mm     G. Age:  32w 5d         42  %    FL/BPD:     73.6   %    71 - 87
 FL:       63.7  mm     G. Age:  32w 6d         36  %    FL/AC:      22.2   %    20 - 24

 Est. FW:    8425  gm    4 lb 12 oz      49  %
OB History

 Gravidity:    3         Term:   1        Prem:   0        SAB:   1
 TOP:          0       Ectopic:  0        Living: 1
Gestational Age

 LMP:           32w 1d        Date:  07/01/18                 EDD:   04/07/19
 U/S Today:     34w 1d                                        EDD:   03/24/19
 Best:          33w 0d     Det. By:  Early Ultrasound         EDD:   04/01/19
                                     (08/04/18)
Anatomy

 Cranium:               Appears normal         Aortic Arch:            Previously seen
 Cavum:                 Previously seen        Ductal Arch:            Previously seen
 Ventricles:            Previously seen        Diaphragm:              Previously seen
 Choroid Plexus:        Previously seen        Stomach:                Appears normal, left
                                                                       sided
 Cerebellum:            Previously seen        Abdomen:                Previously seen
 Posterior Fossa:       Previously seen        Abdominal Wall:         Previously seen
 Nuchal Fold:           Previously seen        Cord Vessels:           Previously seen
 Face:                  Orbits and profile     Kidneys:                Appear normal
                        previously seen
 Lips:                  Appears normal         Bladder:                Appears normal
 Thoracic:              Appears normal         Spine:                  Previously seen
 Heart:                 Previously seen        Upper Extremities:      Previously seen
 RVOT:                  Previously seen        Lower Extremities:      Previously seen
 LVOT:                  Previously seen

 Other:  Heels and Nasal bone visualized previously. Technically difficult due
         to maternal habitus and fetal position.
Cervix Uterus Adnexa

 Cervix
 Not visualized (advanced GA >71wks)
Impression

 Normal interval growth.
 BMI >40
 42UQ9
Recommendations
 Follow up growth in 4 weeks.

## 2020-09-30 IMAGING — US US MFM OB FOLLOW UP
1 series · 13 of 28 positions shown · non-contrast
Comparison: none

[Series 1: us mfm ob follow up · 13 of 35 slices shown]
[im 2/35]
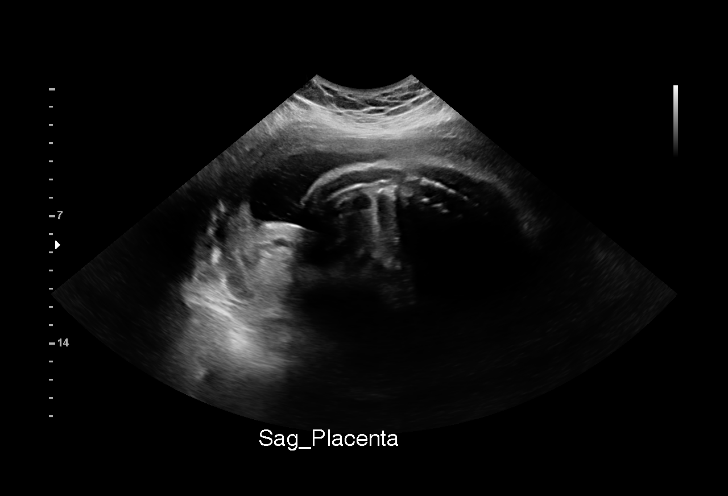
[im 4/35]
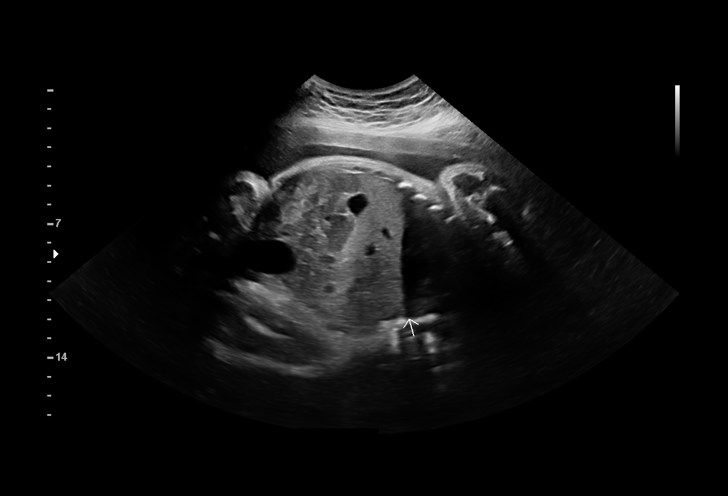
[im 7/35]
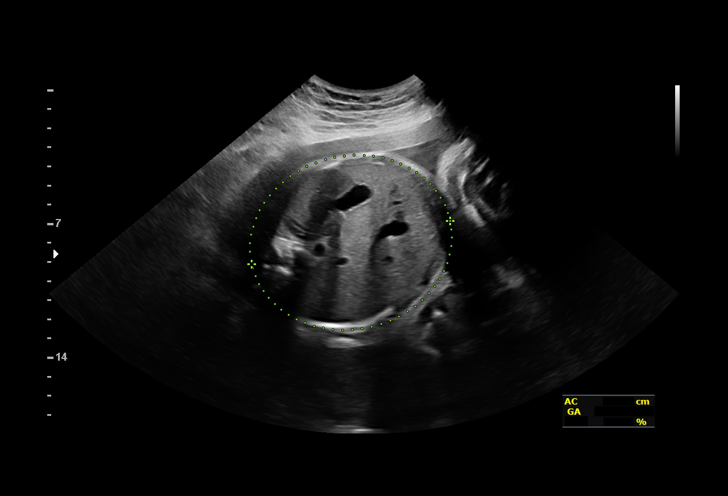
[im 9/35]
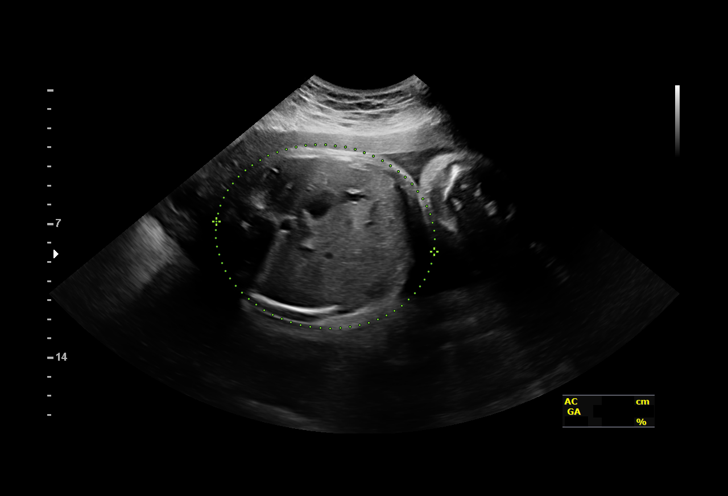
[im 12/35]
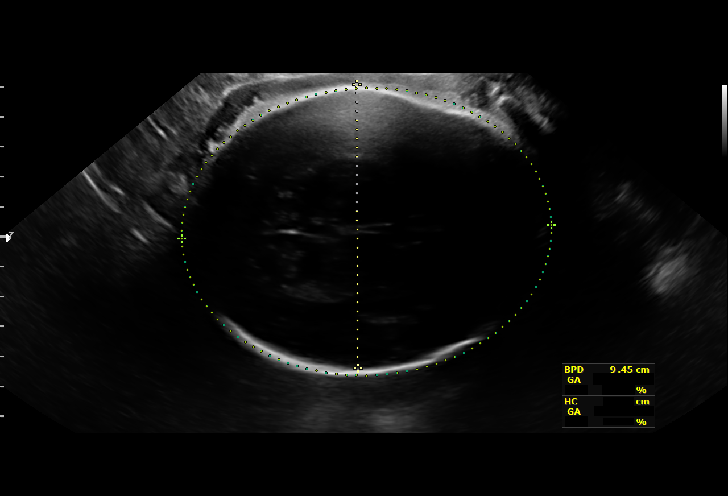
[im 14/35]
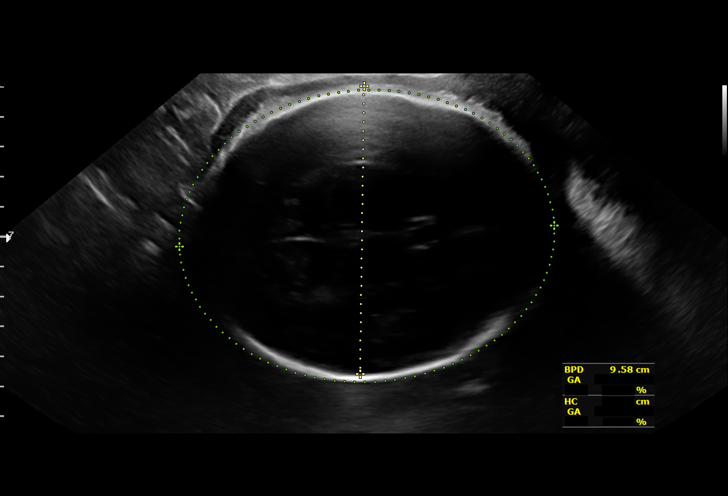
[im 18/35]
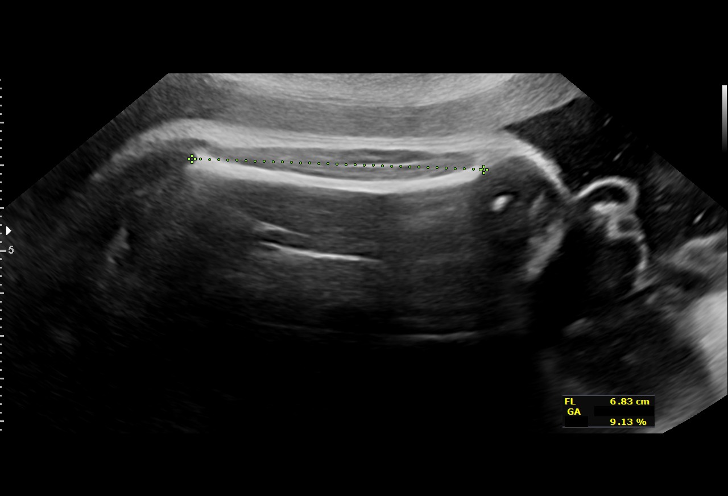
[im 21/35]
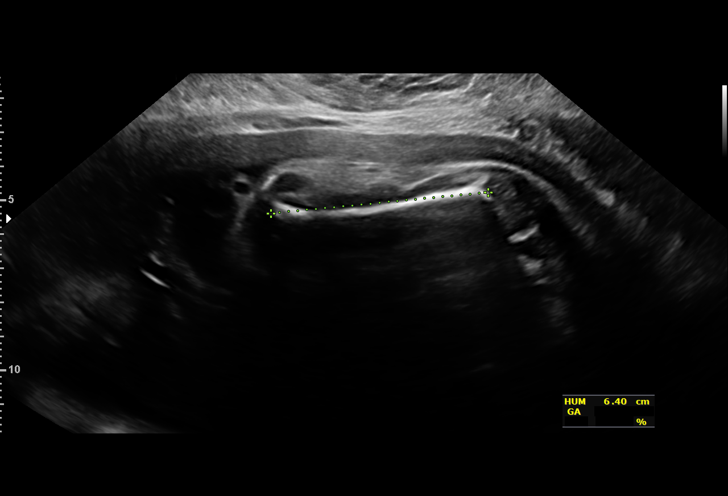
[im 23/35]
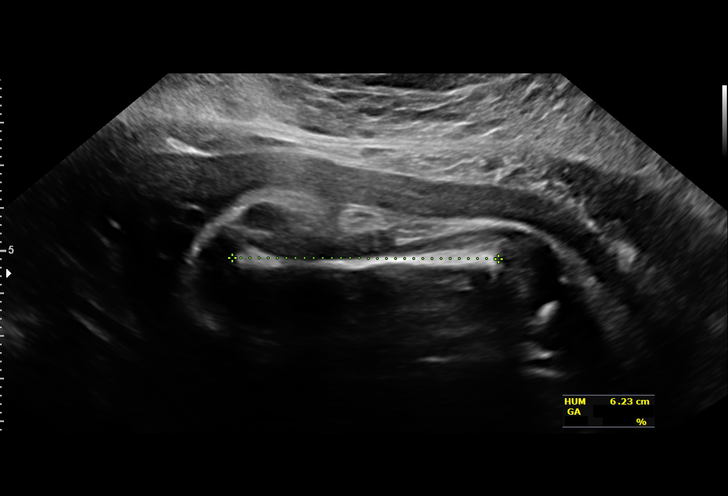
[im 26/35]
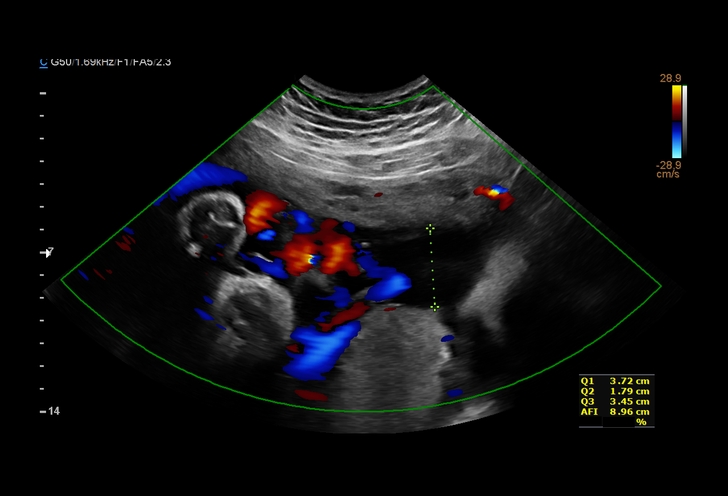
[im 28/35]
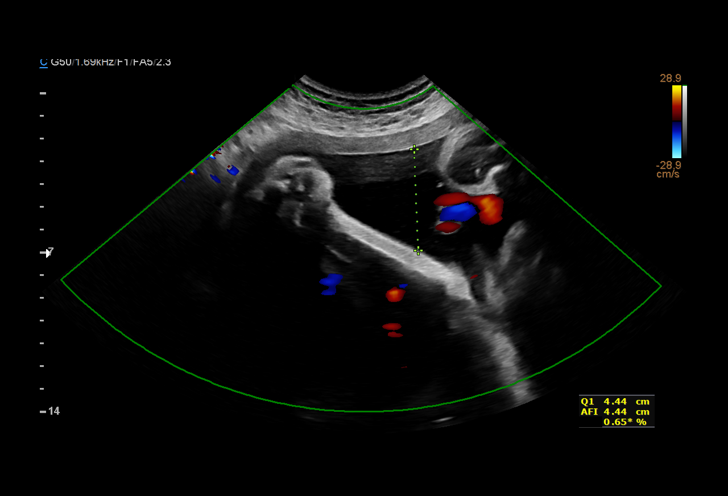
[im 31/35]
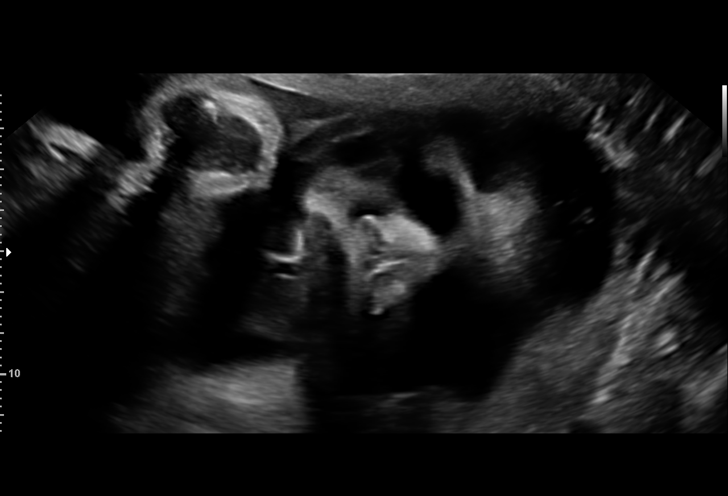
[im 33/35]
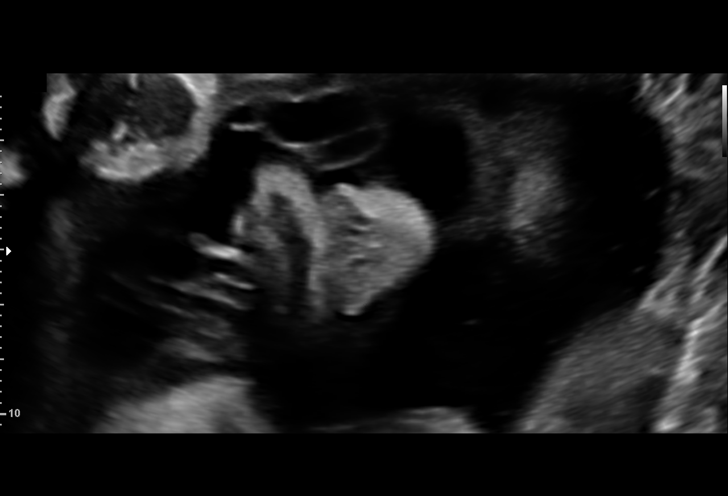

[13 of 28 positions shown; findings below may reference images not displayed]

[address]

                                                       [place]
 ----------------------------------------------------------------------

 ----------------------------------------------------------------------
Indications

  Encounter for other antenatal screening
  follow-up (Low Risk NIPS)
  Medical complication of pregnancy
  (pseudotumor cerebri)
  Obesity complicating pregnancy, second
  trimester (pregravid BMI 42)
  Poor obstetric history: Previous gestational
  diabetes
  Uterine size-date discrepancy, second
  trimester
  Negative AFP
  37 weeks gestation of pregnancy
 ----------------------------------------------------------------------
Vital Signs

 BMI:
Fetal Evaluation

 Num Of Fetuses:          1
 Fetal Heart Rate(bpm):   140
 Cardiac Activity:        Observed
 Presentation:            Cephalic
 Placenta:                Posterior
 P. Cord Insertion:       Previously Visualized

 Amniotic Fluid
 AFI FV:      Within normal limits

 AFI Sum(cm)     %Tile       Largest Pocket(cm)
 16.13           61
 RUQ(cm)       RLQ(cm)       LUQ(cm)        LLQ(cm)

Biometry

 BPD:      94.7  mm     G. Age:  38w 4d         94  %    CI:        75.12   %    70 - 86
                                                         FL/HC:       19.7  %    20.8 -
 HC:      346.6  mm     G. Age:  40w 1d         91  %    HC/AC:       1.06       0.92 -
 AC:       327   mm     G. Age:  36w 4d         53  %    FL/BPD:      72.2  %    71 - 87
 FL:       68.4  mm     G. Age:  35w 1d          9  %    FL/AC:       20.9  %    20 - 24
 HUM:      62.8  mm     G. Age:  36w 3d         59  %
 LV:        3.4  mm

 Est. FW:    5815   gm   6 lb 11 oz      52  %
OB History

 Gravidity:    3         Term:   1        Prem:   0        SAB:   1
 TOP:          0       Ectopic:  0        Living: 1
Gestational Age

 LMP:           36w 1d        Date:  07/01/18                 EDD:   04/07/19
 U/S Today:     37w 4d                                        EDD:   03/28/19
 Best:          37w 0d     Det. By:  Early Ultrasound         EDD:   04/01/19
                                     (08/04/18)
Anatomy

 Cranium:               Appears normal         Aortic Arch:            Previously seen
 Cavum:                 Previously seen        Ductal Arch:            Previously seen
 Ventricles:            Appears normal         Diaphragm:              Appears normal
 Choroid Plexus:        Previously seen        Stomach:                Appears normal, left
                                                                       sided
 Cerebellum:            Previously seen        Abdomen:                Previously seen
 Posterior Fossa:       Previously seen        Abdominal Wall:         Previously seen
 Nuchal Fold:           Previously seen        Cord Vessels:           Previously seen
 Face:                  Orbits and profile     Kidneys:                Previously seen
                        previously seen
 Lips:                  Appears normal         Bladder:                Appears normal
 Thoracic:              Appears normal         Spine:                  Previously seen
 Heart:                 Previously seen        Upper Extremities:      Previously seen
 RVOT:                  Previously seen        Lower Extremities:      Previously seen
 LVOT:                  Previously seen

 Other:  Heels and Nasal bone visualized previously. Technically difficult due
         to maternal habitus and fetal position.
Impression

 Normal interval growth.
 BMI >40
 Size consistent with dates
Recommendations

 Follow up as clinically indicated
